# Patient Record
Sex: Female | Born: 1985 | Race: Black or African American | Hispanic: No | Marital: Married | Smoking: Current every day smoker
Health system: Southern US, Community
[De-identification: ages and names within clinical notes are randomized; demographics above are authoritative.]

## PROBLEM LIST (undated history)

## (undated) DIAGNOSIS — N946 Dysmenorrhea, unspecified: Secondary | ICD-10-CM

## (undated) DIAGNOSIS — Z8742 Personal history of other diseases of the female genital tract: Secondary | ICD-10-CM

## (undated) DIAGNOSIS — F32A Depression, unspecified: Secondary | ICD-10-CM

## (undated) DIAGNOSIS — G4730 Sleep apnea, unspecified: Secondary | ICD-10-CM

## (undated) DIAGNOSIS — D649 Anemia, unspecified: Secondary | ICD-10-CM

## (undated) DIAGNOSIS — F329 Major depressive disorder, single episode, unspecified: Secondary | ICD-10-CM

## (undated) HISTORY — PX: TUBAL LIGATION: SHX77

## (undated) HISTORY — DX: Depression, unspecified: F32.A

## (undated) HISTORY — DX: Dysmenorrhea, unspecified: N94.6

## (undated) HISTORY — DX: Personal history of other diseases of the female genital tract: Z87.42

## (undated) HISTORY — DX: Sleep apnea, unspecified: G47.30

## (undated) HISTORY — DX: Major depressive disorder, single episode, unspecified: F32.9

## (undated) HISTORY — DX: Anemia, unspecified: D64.9

---

## 2007-03-13 DIAGNOSIS — Z8742 Personal history of other diseases of the female genital tract: Secondary | ICD-10-CM

## 2007-03-13 HISTORY — DX: Personal history of other diseases of the female genital tract: Z87.42

## 2012-02-19 LAB — HM HIV SCREENING LAB: HM HIV SCREENING: NEGATIVE

## 2012-05-10 HISTORY — PX: LAPAROSCOPIC TUBAL LIGATION: SUR803

## 2016-10-13 ENCOUNTER — Emergency Department: Payer: Medicaid Other

## 2016-10-13 ENCOUNTER — Encounter: Payer: Self-pay | Admitting: Emergency Medicine

## 2016-10-13 ENCOUNTER — Emergency Department
Admission: EM | Admit: 2016-10-13 | Discharge: 2016-10-13 | Disposition: A | Discharge status: Home / Self Care | Payer: Medicaid Other | Attending: Emergency Medicine | Admitting: Emergency Medicine

## 2016-10-13 DIAGNOSIS — W01190A Fall on same level from slipping, tripping and stumbling with subsequent striking against furniture, initial encounter: Secondary | ICD-10-CM | POA: Diagnosis not present

## 2016-10-13 DIAGNOSIS — Y999 Unspecified external cause status: Secondary | ICD-10-CM | POA: Diagnosis not present

## 2016-10-13 DIAGNOSIS — Y9383 Activity, rough housing and horseplay: Secondary | ICD-10-CM | POA: Diagnosis not present

## 2016-10-13 DIAGNOSIS — S20212A Contusion of left front wall of thorax, initial encounter: Secondary | ICD-10-CM | POA: Diagnosis not present

## 2016-10-13 DIAGNOSIS — F17210 Nicotine dependence, cigarettes, uncomplicated: Secondary | ICD-10-CM | POA: Diagnosis not present

## 2016-10-13 DIAGNOSIS — Y929 Unspecified place or not applicable: Secondary | ICD-10-CM | POA: Insufficient documentation

## 2016-10-13 DIAGNOSIS — S20302A Unspecified superficial injuries of left front wall of thorax, initial encounter: Secondary | ICD-10-CM | POA: Diagnosis present

## 2016-10-13 MED ORDER — CYCLOBENZAPRINE HCL 5 MG PO TABS: 5.0000 mg | ORAL_TABLET | Freq: Three times a day (TID) | ORAL | 0 refills | Status: DC | PRN

## 2016-10-13 MED ORDER — KETOROLAC TROMETHAMINE 30 MG/ML IJ SOLN
30.0000 mg | Freq: Once | INTRAMUSCULAR | Status: AC
  Administered 2016-10-13: 30 mg via INTRAMUSCULAR
  Filled 2016-10-13: qty 1

## 2016-10-13 MED ORDER — TETRACAINE HCL 0.5 % OP SOLN: 2.0000 [drp] | Freq: Once | OPHTHALMIC | Status: DC

## 2016-10-13 MED ORDER — KETOROLAC TROMETHAMINE 10 MG PO TABS: 10.0000 mg | ORAL_TABLET | Freq: Four times a day (QID) | ORAL | 0 refills | Status: AC | PRN

## 2016-10-13 NOTE — Discharge Instructions (Signed)
Take medication as prescribed. Return to emergency department if symptoms worsen and follow-up with PCP as needed.   °

## 2016-10-13 NOTE — ED Provider Notes (Signed)
[facility] Emergency Department Provider Note   ____________________________________________   I have reviewed the triage vital signs and the nursing notes.   HISTORY  Chief Complaint Fall    HPI Taylor [name] is a 31 y.o. female presents to the emergency department with left-sided rib pain after falling and landing across her box spring of her bed while playing with her children yesterday. Patient notes pain at rest, with torso movement and with deep breathing. Patient denies noting any crepitus or paradoxical movement of the left rib cage. Patient denies any shortness of breath or difficulty breathing. Patient denies fever, chills, headache, vision changes, chest pain, chest tightness, abdominal pain, nausea and vomiting.  History reviewed. No pertinent past medical history.  There are no active problems to display for this patient.   History reviewed. No pertinent surgical history.  Prior to Admission medications   Medication Sig Start Date End Date Taking? Authorizing Provider  cyclobenzaprine (FLEXERIL) 5 MG tablet Take 1 tablet (5 mg total) by mouth 3 (three) times daily as needed. 10/13/16   Valrie Jia M, PA-C  ketorolac (TORADOL) 10 MG tablet Take 1 tablet (10 mg total) by mouth every 6 (six) hours as needed. 10/13/16 10/18/16  Jaxan Michel, Jordan Likesraci M, PA-C    Allergies Patient has no known allergies.  History reviewed. No pertinent family history.  Social History Social History  Substance Use Topics  . Smoking status: Current Every Day Smoker  . Smokeless tobacco: Never Used  . Alcohol use No    Review of Systems Constitutional: Negative for fever/chills Eyes: No visual changes. ENT:  Negative for sore throat and for difficulty swallowing Cardiovascular: Denies chest pain. Respiratory: Denies cough. Denies shortness of breath. Gastrointestinal: No abdominal pain.  No nausea, vomiting, diarrhea. Genitourinary: Negative for  dysuria. Musculoskeletal: Positive for left-sided rib pain. Skin: Negative for rash. Neurological: Negative for headaches.  Negative focal weakness or numbness. Negative for loss of consciousness. Able to ambulate. ____________________________________________   PHYSICAL EXAM:  VITAL SIGNS: ED Triage Vitals  Enc Vitals Group     BP 10/13/16 1516 121/74     Pulse Rate 10/13/16 1516 85     Resp 10/13/16 1516 20     Temp 10/13/16 1516 98.8 F (37.1 C)     Temp Source 10/13/16 1516 Oral     SpO2 10/13/16 1516 99 %     Weight 10/13/16 1516 106 lb (48.1 kg)     Height 10/13/16 1516 5\' 2"  (1.575 m)     Head Circumference --      Peak Flow --      Pain Score 10/13/16 1512 8     Pain Loc --      Pain Edu? --      Excl. in GC? --     Constitutional: Alert and oriented. Well appearing and in no acute distress.  Eyes: Conjunctivae are normal. PERRL. EOMI  Head: Normocephalic and atraumatic. ENT:      Ears: Canals clear. TMs intact bilaterally.      Nose: No congestion/rhinnorhea.      Mouth/Throat: Mucous membranes are moist. Neck:Supple. No thyromegaly. No stridor.  Cardiovascular: Normal rate, regular rhythm. Normal S1 and S2.  Good peripheral circulation. Respiratory: Normal respiratory effort without tachypnea or retractions. Lungs CTAB. Good air entry to the bases with no decreased or absent breath sounds. Hematological/Lymphatic/Immunological: No cervical lymphadenopathy. Cardiovascular: Normal rate, regular rhythm. Normal distal pulses. Respiratory: Normal respiratory effort. No wheezes/rales/rhonchi. Lungs CTAB with no W/R/R. Gastrointestinal:  Bowel sounds 4 quadrants. Soft and nontender to palpation. No guarding or rigidity. No palpable masses. No distention. No CVA tenderness. Musculoskeletal: Left-sided rib pain with palpable tenderness along lateral ribs and intercostal muscles. No crepitus or paradoxical movement. Nontender with normal range of motion in all  extremities. Neurologic: Normal speech and language. No gross focal neurologic deficits are appreciated. No gait instability.  Skin:  Skin is warm, dry and intact. No rash noted. Psychiatric: Mood and affect are normal. Speech and behavior are normal. Patient exhibits appropriate insight and judgement.  ____________________________________________   LABS (all labs ordered are listed, but only abnormal results are displayed)  Labs Reviewed - No data to display ____________________________________________  EKG None ____________________________________________  RADIOLOGY DG Rib Unilateral w/ chest left FINDINGS: No fracture or other bone lesions are seen involving the ribs. There is no evidence of pneumothorax or pleural effusion. Both lungs are clear. Heart size and mediastinal contours are within normal limits.  IMPRESSION: Negative exam. ____________________________________________   PROCEDURES  Procedure(s) performed: no    Critical Care performed: no ____________________________________________   INITIAL IMPRESSION / ASSESSMENT AND PLAN / ED COURSE  Pertinent labs & imaging results that were available during my care of the patient were reviewed by me and considered in my medical decision making (see chart for details).  Patient presented with left-sided rib pain following a fall. Patient physical exam findings and imaging are reassuring of no acute fracture or neurovascular injury. Patient responded well to Toradol given during course of care in the emergency department. Patient will be given a five-day course of Toradol and and Flexeril for continued pain and inflammation management. Patient advised to follow up with PCP as needed and was also advised to return to the emergency department for symptoms that change or worsen.   ____________________________________________   FINAL CLINICAL IMPRESSION(S) / ED DIAGNOSES  Final diagnoses:  Contusion of rib on left  side, initial encounter       NEW MEDICATIONS STARTED DURING THIS VISIT:  Discharge Medication List as of 10/13/2016  4:25 PM    START taking these medications   Details  cyclobenzaprine (FLEXERIL) 5 MG tablet Take 1 tablet (5 mg total) by mouth 3 (three) times daily as needed., Starting Sat 10/13/2016, Print    ketorolac (TORADOL) 10 MG tablet Take 1 tablet (10 mg total) by mouth every 6 (six) hours as needed., Starting Sat 10/13/2016, Until Thu 10/18/2016, Print         Note:  This document was prepared using Dragon voice recognition software and may include unintentional dictation errors.    Clois ComberLittle, Srihitha Tagliaferri M, PA-C 10/13/16 1647    Phineas SemenGoodman, [place], MD 10/13/16 [phone]

## 2016-10-13 NOTE — ED Triage Notes (Signed)
Pt to ed with c/o fall on Wednesday on left side, rib area.  Pt states she has had pain worse with movement in left ribs since.  Pt ambulates well and appears in nad at triage.

## 2016-10-27 ENCOUNTER — Emergency Department: Payer: Medicaid Other

## 2016-10-27 ENCOUNTER — Encounter: Payer: Self-pay | Admitting: Emergency Medicine

## 2016-10-27 ENCOUNTER — Emergency Department
Admission: EM | Admit: 2016-10-27 | Discharge: 2016-10-27 | Disposition: A | Discharge status: Home / Self Care | Payer: Medicaid Other | Attending: Emergency Medicine | Admitting: Emergency Medicine

## 2016-10-27 DIAGNOSIS — F17200 Nicotine dependence, unspecified, uncomplicated: Secondary | ICD-10-CM | POA: Insufficient documentation

## 2016-10-27 DIAGNOSIS — Y999 Unspecified external cause status: Secondary | ICD-10-CM | POA: Diagnosis not present

## 2016-10-27 DIAGNOSIS — S99912A Unspecified injury of left ankle, initial encounter: Secondary | ICD-10-CM | POA: Diagnosis present

## 2016-10-27 DIAGNOSIS — Y929 Unspecified place or not applicable: Secondary | ICD-10-CM | POA: Insufficient documentation

## 2016-10-27 DIAGNOSIS — Y939 Activity, unspecified: Secondary | ICD-10-CM | POA: Diagnosis not present

## 2016-10-27 DIAGNOSIS — S93402A Sprain of unspecified ligament of left ankle, initial encounter: Secondary | ICD-10-CM | POA: Diagnosis not present

## 2016-10-27 DIAGNOSIS — X58XXXA Exposure to other specified factors, initial encounter: Secondary | ICD-10-CM | POA: Diagnosis not present

## 2016-10-27 MED ORDER — IBUPROFEN 800 MG PO TABS: 800.0000 mg | ORAL_TABLET | Freq: Three times a day (TID) | ORAL | 0 refills | Status: DC | PRN

## 2016-10-27 MED ORDER — IBUPROFEN 800 MG PO TABS
800.0000 mg | ORAL_TABLET | Freq: Once | ORAL | Status: AC
  Administered 2016-10-27: 800 mg via ORAL
  Filled 2016-10-27: qty 1

## 2016-10-27 NOTE — ED Triage Notes (Signed)
Pt c/o twisted ankle last night.  C/o pain to left ankle. No deformity noted.  Reports cannot walk r/t pain.

## 2016-10-27 NOTE — ED Provider Notes (Signed)
[facility] Emergency Department Provider Note   ____________________________________________   I have reviewed the triage vital signs and the nursing notes.   HISTORY  Chief Complaint Ankle Pain    HPI Taylor [name] is a 31 y.o. female Presents to emergency department with left ankle pain. Patient reports twisting her ankle last night however, she is unable to recall how she injured it. Patient stated she "had been drinking" and is unsure of the mechanism of injury. Patient noted pain initially tolerable however, when she awoke she was unable to bear weight with increase swelling. Patient localizes pain to the lateral malleoli. Patient reports pain that increases with movement and denies changes in sensation. Patient denies fever, chills, headache, vision changes, chest pain, chest tightness, shortness of breath, abdominal pain, nausea and vomiting.  History reviewed. No pertinent past medical history.  There are no active problems to display for this patient.   History reviewed. No pertinent surgical history.  Prior to Admission medications   Medication Sig Start Date End Date Taking? Authorizing Provider  cyclobenzaprine (FLEXERIL) 5 MG tablet Take 1 tablet (5 mg total) by mouth 3 (three) times daily as needed. 10/13/16   Jerricka Carvey M, PA-C  ibuprofen (ADVIL,MOTRIN) 800 MG tablet Take 1 tablet (800 mg total) by mouth every 8 (eight) hours as needed for moderate pain. 10/27/16   Jhania Etherington M, PA-C    Allergies Patient has no known allergies.  History reviewed. No pertinent family history.  Social History Social History  Substance Use Topics  . Smoking status: Current Every Day Smoker  . Smokeless tobacco: Never Used  . Alcohol use No    Review of Systems Constitutional: Negative for fever/chills Eyes: No visual changes. ENT:  Negative for sore throat and for difficulty swallowing Cardiovascular: Denies chest pain. Respiratory: Denies  cough. Denies shortness of breath. Gastrointestinal: No abdominal pain.  No nausea, vomiting, diarrhea. Genitourinary: Negative for dysuria. Musculoskeletal: Positivefor left lateral ankle pain and swelling Skin: Negative for rash. Neurological: Negative for headaches.  Negative focal weakness or numbness. Negative for loss of consciousness. Unableto ambulate. ____________________________________________   PHYSICAL EXAM:  VITAL SIGNS: ED Triage Vitals [10/27/16 1521]  Enc Vitals Group     BP (!) 130/91     Pulse Rate (!) 102     Resp 16     Temp 98.9 F (37.2 C)     Temp Source Oral     SpO2 97 %     Weight 105 lb (47.6 kg)     Height 5\' 2"  (1.575 m)     Head Circumference      Peak Flow      Pain Score 10     Pain Loc      Pain Edu?      Excl. in GC?     Constitutional: Alert and oriented. Well appearing and in no acute distress.  Eyes: Conjunctivae are normal. PERRL. EOMI  Head: Normocephalic and atraumatic. ENT:      Ears: Canals clear. TMs intact bilaterally.      Nose: No congestion/rhinnorhea.      Mouth/Throat: Mucous membranes are moist.  Neck:Supple. No thyromegaly. No stridor.  Cardiovascular: Normal rate, regular rhythm. Normal S1 and S2.  Good peripheral circulation. Respiratory: Normal respiratory effort without tachypnea or retractions. Lungs CTAB. No wheezes/rales/rhonchi. Good air entry to the bases with no decreased or absent breath sounds. Hematological/Lymphatic/Immunological: No cervical lymphadenopathy. Cardiovascular: Normal rate, regular rhythm. Normal distal pulses. Gastrointestinal: Bowel sounds 4  quadrants. Soft and nontender to palpation. No guarding or rigidity. No palpable masses. No distention. No CVA tenderness. Musculoskeletal: Left lateral ankle pain with swelling along the left malleoli. Tenderness to palpation over the lateral ankle negative into structures. Ankle range motion intact in all planes however painful. Intact sensation  throughout the foot and ankle. Otherwise, nontender with normal range of motion in all extremities. Neurologic: Normal speech and language. No gross focal neurologic deficits are appreciated.  Skin:  Skin is warm, dry and intact. No rash noted. Psychiatric: Mood and affect are normal. Speech and behavior are normal. Patient exhibits appropriate insight and judgement.  ____________________________________________   LABS (all labs ordered are listed, but only abnormal results are displayed)  Labs Reviewed - No data to display ____________________________________________  EKG none ____________________________________________  RADIOLOGY DG ankle complete left FINDINGS: Frontal, oblique, and lateral views were obtained. There is mild soft tissue swelling with focal joint effusion. There is no evident fracture. No appreciable joint space narrowing or erosion. Ankle mortise appears intact.  IMPRESSION: Soft tissue swelling with joint effusion. Question ligamentous injury. No evident fracture. Ankle mortise appears intact. ____________________________________________   PROCEDURES  Procedure(s) performed:  SPLINT APPLICATION Date/Time: 5:41 PM Authorized by: Clois Comber Consent: Verbal consent obtained. Risks and benefits: risks, benefits and alternatives were discussed Consent given by: patient Splint applied by: RN Location details: left ankle Splint type: ortho-Glass stirrup splint and Ace wrap Supplies used: ortho-Glass stirrup splint and Ace wrap Post-procedure: The splinted body part was neurovascularly unchanged following the procedure. Patient tolerance: Patient tolerated the procedure well with no immediate complications.    Critical Care performed: no ____________________________________________   INITIAL IMPRESSION / ASSESSMENT AND PLAN / ED COURSE  Pertinent labs & imaging results that were available during my care of the patient were reviewed by me and  considered in my medical decision making (see chart for details).   Patient presented with left ankle pain after twisting her ankle yesterday. Patient history,physical exam findings and imaging are reassuring of no acute fracture or neurovascular injury. Left ankle stabilized with Ortho-Glass stirrup splint and patient given crutches for mobility. Patient instructed to utilize crutches weightbearing as tolerated and when not walking or standing keep ankle elevated. Also instructed to utilize ice for pain and inflammation. Patient will be given ibuprofenfor continued pain management. Patient advised to follow up with Orthopedics for continued care as needed and was also advised to return to the emergency department for symptoms that change or worsen.   ____________________________________________   FINAL CLINICAL IMPRESSION(S) / ED DIAGNOSES  Final diagnoses:  Sprain of left ankle, unspecified ligament, initial encounter       NEW MEDICATIONS STARTED DURING THIS VISIT:  New Prescriptions   IBUPROFEN (ADVIL,MOTRIN) 800 MG TABLET    Take 1 tablet (800 mg total) by mouth every 8 (eight) hours as needed for moderate pain.     Note:  This document was prepared using Dragon voice recognition software and may include unintentional dictation errors.    Percell Boston 10/27/16 1741    Jene Every, MD 10/27/16 [phone]

## 2016-10-27 NOTE — Discharge Instructions (Signed)
Walk with crutches as needed with weight bearing as tolerated. When not walking or standing keep ankle elevated and use cold pack for pain and inflammation. Follow up with Orthopedics if symptoms do not improve.

## 2016-11-02 DIAGNOSIS — S93492A Sprain of other ligament of left ankle, initial encounter: Secondary | ICD-10-CM | POA: Diagnosis not present

## 2016-12-26 ENCOUNTER — Emergency Department
Admission: EM | Admit: 2016-12-26 | Discharge: 2016-12-26 | Discharge status: Against Medical Advice | Payer: Medicaid Other | Attending: Emergency Medicine | Admitting: Emergency Medicine

## 2016-12-26 ENCOUNTER — Emergency Department
Admission: EM | Admit: 2016-12-26 | Discharge: 2016-12-26 | Disposition: A | Discharge status: Home / Self Care | Payer: Medicaid Other | Source: Home / Self Care | Attending: Emergency Medicine | Admitting: Emergency Medicine

## 2016-12-26 ENCOUNTER — Emergency Department: Payer: Medicaid Other

## 2016-12-26 ENCOUNTER — Encounter: Payer: Self-pay | Admitting: Emergency Medicine

## 2016-12-26 DIAGNOSIS — R091 Pleurisy: Secondary | ICD-10-CM

## 2016-12-26 DIAGNOSIS — J069 Acute upper respiratory infection, unspecified: Secondary | ICD-10-CM

## 2016-12-26 DIAGNOSIS — R079 Chest pain, unspecified: Secondary | ICD-10-CM | POA: Diagnosis not present

## 2016-12-26 DIAGNOSIS — M542 Cervicalgia: Secondary | ICD-10-CM | POA: Diagnosis not present

## 2016-12-26 DIAGNOSIS — F17200 Nicotine dependence, unspecified, uncomplicated: Secondary | ICD-10-CM | POA: Diagnosis not present

## 2016-12-26 DIAGNOSIS — M25512 Pain in left shoulder: Secondary | ICD-10-CM | POA: Diagnosis not present

## 2016-12-26 DIAGNOSIS — Z5320 Procedure and treatment not carried out because of patient's decision for unspecified reasons: Secondary | ICD-10-CM | POA: Insufficient documentation

## 2016-12-26 LAB — CBC WITH DIFFERENTIAL/PLATELET
BASOS ABS: 0 10*3/uL (ref 0–0.1)
Basophils Relative: 0 %
Eosinophils Absolute: 0.1 10*3/uL (ref 0–0.7)
Eosinophils Relative: 1 %
HEMATOCRIT: 37.2 % (ref 35.0–47.0)
Hemoglobin: 12.3 g/dL (ref 12.0–16.0)
LYMPHS ABS: 1.9 10*3/uL (ref 1.0–3.6)
LYMPHS PCT: 17 %
MCH: 31.5 pg (ref 26.0–34.0)
MCHC: 32.9 g/dL (ref 32.0–36.0)
MCV: 95.5 fL (ref 80.0–100.0)
MONO ABS: 0.8 10*3/uL (ref 0.2–0.9)
MONOS PCT: 7 %
NEUTROS ABS: 8.5 10*3/uL — AB (ref 1.4–6.5)
Neutrophils Relative %: 75 %
Platelets: 255 10*3/uL (ref 150–440)
RBC: 3.9 MIL/uL (ref 3.80–5.20)
RDW: 17 % — AB (ref 11.5–14.5)
WBC: 11.3 10*3/uL — ABNORMAL HIGH (ref 3.6–11.0)

## 2016-12-26 LAB — COMPREHENSIVE METABOLIC PANEL
ALBUMIN: 4 g/dL (ref 3.5–5.0)
ALT: 8 U/L — ABNORMAL LOW (ref 14–54)
ANION GAP: 10 (ref 5–15)
AST: 17 U/L (ref 15–41)
Alkaline Phosphatase: 73 U/L (ref 38–126)
BUN: 10 mg/dL (ref 6–20)
CHLORIDE: 106 mmol/L (ref 101–111)
CO2: 21 mmol/L — ABNORMAL LOW (ref 22–32)
Calcium: 9.7 mg/dL (ref 8.9–10.3)
Creatinine, Ser: 0.72 mg/dL (ref 0.44–1.00)
GFR calc Af Amer: >60 mL/min (ref >60)
GFR calc non Af Amer: >60 mL/min (ref >60)
GLUCOSE: 124 mg/dL — AB (ref 65–99)
POTASSIUM: 4 mmol/L (ref 3.5–5.1)
SODIUM: 137 mmol/L (ref 135–145)
Total Bilirubin: 0.5 mg/dL (ref 0.3–1.2)
Total Protein: 7.3 g/dL (ref 6.5–8.1)

## 2016-12-26 LAB — TROPONIN I: Troponin I: <0.03 ng/mL (ref <0.03)

## 2016-12-26 MED ORDER — IBUPROFEN 600 MG PO TABS: 600.0000 mg | ORAL_TABLET | Freq: Three times a day (TID) | ORAL | 0 refills | Status: DC | PRN

## 2016-12-26 MED ORDER — ACETAMINOPHEN 500 MG PO TABS
1000.0000 mg | ORAL_TABLET | Freq: Once | ORAL | Status: AC
  Administered 2016-12-26: 1000 mg via ORAL

## 2016-12-26 MED ORDER — IBUPROFEN 400 MG PO TABS
600.0000 mg | ORAL_TABLET | Freq: Once | ORAL | Status: AC
  Administered 2016-12-26: 600 mg via ORAL

## 2016-12-26 MED ORDER — ACETAMINOPHEN 500 MG PO TABS
ORAL_TABLET | ORAL | Status: AC
  Administered 2016-12-26: 1000 mg via ORAL
  Filled 2016-12-26: qty 2

## 2016-12-26 MED ORDER — IBUPROFEN 600 MG PO TABS
ORAL_TABLET | ORAL | Status: AC
  Administered 2016-12-26: 600 mg via ORAL
  Filled 2016-12-26: qty 1

## 2016-12-26 NOTE — Discharge Instructions (Signed)
It is normal feet be sick for a full 5-7 days with this infection. Please take 600 mg of ibuprofen 3 times a day as needed for pain and fever. Return to the emergency department for any concerns such as worsening pain, worsening shortness of breath, or for any other issues whatsoever.  It was a pleasure to take care of you today, and thank you for coming to our emergency department.  If you have any questions or concerns before leaving please ask the nurse to grab me and I'm more than happy to go through your aftercare instructions again.  If you were prescribed any opioid pain medication today such as Norco, Vicodin, Percocet, morphine, hydrocodone, or oxycodone please make sure you do not drive when you are taking this medication as it can alter your ability to drive safely.  If you have any concerns once you are home that you are not improving or are in fact getting worse before you can make it to your follow-up appointment, please do not hesitate to call [phone] and come back for further evaluation.  Merrily Brittle, MD  Results for orders placed or performed during the hospital encounter of 12/26/16  Comprehensive metabolic panel  Result Value Ref Range   Sodium 137 135 - 145 mmol/L   Potassium 4.0 3.5 - 5.1 mmol/L   Chloride 106 101 - 111 mmol/L   CO2 21 (L) 22 - 32 mmol/L   Glucose, Bld 124 (H) 65 - 99 mg/dL   BUN 10 6 - 20 mg/dL   Creatinine, Ser 3.24 0.44 - 1.00 mg/dL   Calcium 9.7 8.9 - 40.1 mg/dL   Total Protein 7.3 6.5 - 8.1 g/dL   Albumin 4.0 3.5 - 5.0 g/dL   AST 17 15 - 41 U/L   ALT 8 (L) 14 - 54 U/L   Alkaline Phosphatase 73 38 - 126 U/L   Total Bilirubin 0.5 0.3 - 1.2 mg/dL   GFR calc non Af Amer >60 >60 mL/min   GFR calc Af Amer >60 >60 mL/min   Anion gap 10 5 - 15  CBC with Differential  Result Value Ref Range   WBC 11.3 (H) 3.6 - 11.0 K/uL   RBC 3.90 3.80 - 5.20 MIL/uL   Hemoglobin 12.3 12.0 - 16.0 g/dL   HCT 02.7 25.3 - 66.4 %   MCV 95.5 80.0 - 100.0 fL   MCH 31.5  26.0 - 34.0 pg   MCHC 32.9 32.0 - 36.0 g/dL   RDW 40.3 (H) 47.4 - 25.9 %   Platelets 255 150 - 440 K/uL   Neutrophils Relative % 75 %   Neutro Abs 8.5 (H) 1.4 - 6.5 K/uL   Lymphocytes Relative 17 %   Lymphs Abs 1.9 1.0 - 3.6 K/uL   Monocytes Relative 7 %   Monocytes Absolute 0.8 0.2 - 0.9 K/uL   Eosinophils Relative 1 %   Eosinophils Absolute 0.1 0 - 0.7 K/uL   Basophils Relative 0 %   Basophils Absolute 0.0 0 - 0.1 K/uL  Troponin I  Result Value Ref Range   Troponin I <0.03 <0.03 ng/mL   Dg Chest 2 View  Result Date: 12/26/2016 CLINICAL DATA:  Left chest pain and shortness of breath.  Fever. EXAM: CHEST  2 VIEW COMPARISON:  10/13/2016 FINDINGS: The cardiomediastinal silhouette is within normal limits. The lungs are well inflated and clear. There is no evidence of pleural effusion or pneumothorax. No acute osseous abnormality is identified. IMPRESSION: No active cardiopulmonary disease. Electronically  Signed   By: Sebastian AcheAllen  Grady M.D.   On: 12/26/2016 18:33

## 2016-12-26 NOTE — ED Notes (Signed)
Pt verbalizes d/c teaching and rx. Pt in NAD at time of d/c, pt unable to sign due to signature pad malfnx.

## 2016-12-26 NOTE — ED Triage Notes (Addendum)
Pt returns after having to leave earlier today with c/o left shoulder, neck and left scapula pain that started earlier today.  Pt had to leave earlier to pick her kids up from school.

## 2016-12-26 NOTE — ED Triage Notes (Signed)
Patient states she was sitting car and started having pain to left shoulder, left neck, left scapula.  Denies injury.

## 2016-12-26 NOTE — Discharge Instructions (Signed)
You have decided to discharge prior to your full evaluation for chest pain. Return to the ED immediately for worsening symptoms. Return as soon as possible for continued symptoms.

## 2016-12-26 NOTE — ED Provider Notes (Signed)
[facility] Emergency Department Provider Note  ____________________________________________   First MD Initiated Contact with Patient 12/26/16 1747     (approximate)  I have reviewed the triage vital signs and the nursing notes.   HISTORY  Chief Complaint Muscle Pain (left shoulder, left scapula and left sided neck)    HPI Taylor [name] is a 31 y.o. female history of present emergency department with sudden onset left chest pleuritic pain that began around 3 hours prior to arrival. It happened when she was sitting in her car waiting for her daughter and she got up and began pacing around, however the pain was so intense she called 911 and initially came to our emergency department. She was here for roughly half hour, however after receiving an EKG she received notification that she had to pick her children up from school so she left AGAINST MEDICAL ADVICE and immediately subsequently returned for further evaluation. Her pain is constant throbbing aching. It seems to be a little bit worse when lying flat but not particularly improved with sitting up. For the past 2 days she has had a low-grade fever at home as well as a sore throat and rhinorrhea and nonproductive cough.   History reviewed. No pertinent past medical history.  There are no active problems to display for this patient.   History reviewed. No pertinent surgical history.  Prior to Admission medications   Medication Sig Start Date End Date Taking? Authorizing Provider  cyclobenzaprine (FLEXERIL) 5 MG tablet Take 1 tablet (5 mg total) by mouth 3 (three) times daily as needed. 10/13/16   Little, Traci M, PA-C  ibuprofen (ADVIL,MOTRIN) 600 MG tablet Take 1 tablet (600 mg total) by mouth every 8 (eight) hours as needed. 12/26/16   Merrily Brittle, MD  ibuprofen (ADVIL,MOTRIN) 800 MG tablet Take 1 tablet (800 mg total) by mouth every 8 (eight) hours as needed for moderate pain. 10/27/16   Little, Traci  M, PA-C    Allergies Patient has no known allergies.  No family history on file.  Social History Social History  Substance Use Topics  . Smoking status: Current Every Day Smoker  . Smokeless tobacco: Never Used  . Alcohol use No    Review of Systems Constitutional: positive for fevers ENT: No sore throat. Cardiovascular: positive for chest pain. Respiratory: positive for shortness of breath. Gastrointestinal: No abdominal pain.  No nausea, no vomiting.  No diarrhea.  No constipation. Musculoskeletal: Negative for back pain. Neurological: Negative for headaches   ____________________________________________   PHYSICAL EXAM:  VITAL SIGNS: ED Triage Vitals [12/26/16 1728]  Enc Vitals Group     BP 136/73     Pulse Rate 73     Resp 18     Temp 98.3 F (36.8 C)     Temp Source Oral     SpO2 99 %     Weight      Height      Head Circumference      Peak Flow      Pain Score 8     Pain Loc      Pain Edu?      Excl. in GC?     Constitutional: alert and oriented 4 appears somewhat uncomfortable nontoxic no diaphoresis speaks in full clear sentences although with nasal voice Head: Atraumatic. Nose: positive for congestion Mouth/Throat: No trismus Neck: No stridor.   Cardiovascular: regular rate and rhythm no murmurs. Focally tender across left anterior chest Respiratory: Normal respiratory effort.  No  retractions. Gastrointestinal: soft nontender Neurologic:  Normal speech and language. No gross focal neurologic deficits are appreciated.  Skin:  Skin is warm, dry and intact. No rash noted.    ____________________________________________  LABS (all labs ordered are listed, but only abnormal results are displayed)  Labs Reviewed  COMPREHENSIVE METABOLIC PANEL - Abnormal; Notable for the following:       Result Value   CO2 21 (*)    Glucose, Bld 124 (*)    ALT 8 (*)    All other components within normal limits  CBC WITH DIFFERENTIAL/PLATELET - Abnormal;  Notable for the following:    WBC 11.3 (*)    RDW 17.0 (*)    Neutro Abs 8.5 (*)    All other components within normal limits  TROPONIN I    blood work reviewed and interpreted by me shows slightly elevated white count which is nonspecific and likely secondary to pain __________________________________________  EKG  ED ECG REPORT I, Merrily BrittleNeil Georgi Tuel, the attending physician, personally viewed and interpreted this ECG.  Date: 12/26/2016 EKG Time:  Rate: 77 Rhythm: normal sinus rhythm QRS Axis: normal Intervals: normal ST/T Wave abnormalities: normal Narrative Interpretation: no evidence of acute ischemia  ____________________________________________  RADIOLOGY  chest x-ray reviewed by me with no acute disease ____________________________________________   DIFFERENTIAL includes but not limited to  bronchitis, pneumonia, pericarditis, myocarditis, pericardial effusion   PROCEDURES  Procedure(s) performed: no  Procedures  Critical Care performed: no  Observation: no ____________________________________________   INITIAL IMPRESSION / ASSESSMENT AND PLAN / ED COURSE  Pertinent labs & imaging results that were available during my care of the patient were reviewed by me and considered in my medical decision making (see chart for details).  The patient arrives with congestion and dry cough along with atypical chest pain. Chest x-ray and blood work are pending well nonsteroidals are being given.     ----------------------------------------- 6:50 PM on 12/26/2016 -----------------------------------------  The patient feels markedly improved after ibuprofen and Tylenol. Her EKG is unremarkable, she has a negative troponin, and chest x-rays negative for pneumonia. Her symptoms are most consistent with upper respiratory tract infection and costochondritis versus pleurisy. Regardless she would benefit from a week of nonsteroidals and strict return precautions. The  patient verbalizes understanding and agreement with the plan. ____________________________________________   FINAL CLINICAL IMPRESSION(S) / ED DIAGNOSES  Final diagnoses:  Upper respiratory tract infection, unspecified type  Pleurisy      NEW MEDICATIONS STARTED DURING THIS VISIT:  New Prescriptions   IBUPROFEN (ADVIL,MOTRIN) 600 MG TABLET    Take 1 tablet (600 mg total) by mouth every 8 (eight) hours as needed.     Note:  This document was prepared using Dragon voice recognition software and may include unintentional dictation errors.      Merrily Brittleifenbark, Ahnaf Caponi, MD 12/26/16 [phone]

## 2016-12-26 NOTE — ED Provider Notes (Signed)
[facility] Emergency Department Provider Note ____________________________________________  Time seen: 1605  I have reviewed the triage vital signs and the nursing notes.  HISTORY  Chief Complaint  Back Pain  HPI Taylor [name] is a 31 y.o. female presents to the ED via EMS, for complaints of sudden onset of left chest pain with shoulder neck and scapular pain. Patient denies any injury, accident, or trauma. She also denies any shortness of breath, diaphoresis, or nausea. She describes she had abnormal level of health and activity prior to onset. 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 She describes chest tightness which is worsened in between breaths She reports improvement in her symptoms, but denies resolution. She denies any significant medical history. She is without current chest pain.   History reviewed. No pertinent past medical history.  There are no active problems to display for this patient.  History reviewed. No pertinent surgical history.  Prior to Admission medications   Medication Sig Start Date End Date Taking? Authorizing Provider  cyclobenzaprine (FLEXERIL) 5 MG tablet Take 1 tablet (5 mg total) by mouth 3 (three) times daily as needed. 10/13/16   Little, Traci M, PA-C  ibuprofen (ADVIL,MOTRIN) 800 MG tablet Take 1 tablet (800 mg total) by mouth every 8 (eight) hours as needed for moderate pain. 10/27/16   Little, Traci M, PA-C   Allergies Patient has no known allergies.  No family history on file.  Social History Social History  Substance Use Topics  . Smoking status: Current Every Day Smoker  . Smokeless tobacco: Never Used  . Alcohol use No    Review of Systems  Constitutional: Negative for fever. Eyes: Negative for visual changes. ENT: Negative for sore throat. Cardiovascular: Positive for chest pain. Respiratory: Negative for shortness of  breath. Gastrointestinal: Negative for abdominal pain, vomiting and diarrhea. Genitourinary: Negative for dysuria. Musculoskeletal: Negative for back pain. Skin: Negative for rash. Neurological: Negative for headaches, focal weakness or numbness. ____________________________________________  PHYSICAL EXAM:  VITAL SIGNS: ED Triage Vitals  Enc Vitals Group     BP 12/26/16 1534 108/62     Pulse Rate 12/26/16 1534 88     Resp 12/26/16 1534 16     Temp 12/26/16 1534 99.6 F (37.6 C)     Temp Source 12/26/16 1534 Oral     SpO2 12/26/16 1534 99 %     Weight 12/26/16 1532 106 lb (48.1 kg)     Height 12/26/16 1532 5\' 2"  (1.575 m)     Head Circumference --      Peak Flow --      Pain Score 12/26/16 1532 9     Pain Loc --      Pain Edu? --      Excl. in GC? --     Constitutional: Alert and oriented. Well appearing and in no distress. Head: Normocephalic and atraumatic. Eyes: Conjunctivae are normal. Normal extraocular movements Neck: Supple. No thyromegaly. Cardiovascular: Normal rate, regular rhythm. Normal distal pulses. Respiratory: Normal respiratory effort. No wheezes/rales/rhonchi. Gastrointestinal: Soft and nontender. No distention. Musculoskeletal: Nontender with normal range of motion in all extremities.  Neurologic:  Normal gait without ataxia. Normal speech and language. No gross focal neurologic deficits are appreciated. Skin:  Skin is warm, dry and intact. No rash noted. Psychiatric: Mood and affect are normal. Patient exhibits appropriate insight and judgment. ____________________________________________  EKG  NSR 84 bpm Normal axis  No STEMI ____________________________________________  INITIAL IMPRESSION / ASSESSMENT AND PLAN / ED COURSE  Patient with ED evaluation via EMS, for sudden chest wall pain. Patient notifies the nurse prior to her her evaluation by this provider, that she is going to have to decline any further treatment or evaluation. Patient  describes she has to go back to pick up her children. The caregivers that were initially supposed to keep the toes while she was here being evaluated are not available. The patient understands from this provider that her chest pain work-up has not been completed. She further understands that I cannot at this time discharge her with routine follow-up. She will be discharged AMA, but understands that she is to return to the ED soon as possible. She should also return at any time given her chest pain complaints. She may opt to call 911 for EMS transfer if necessary. Patient verbalizes understanding and endorses that she will return to the ED for further evaluation. The patient appears stable from the standpoint of her vital signs and clinical picture at the time of this disposition. She is discharged to her own care with AMA status at this time for acute left-sided chest pain of unknown etiology at this time. ____________________________________________  FINAL CLINICAL IMPRESSION(S) / ED DIAGNOSES  Final diagnoses:  Chest pain, unspecified type      Lissa Hoard, PA-C 12/26/16 1650    Don Perking, [place], MD 12/27/16 2314

## 2016-12-26 NOTE — ED Notes (Signed)
Patient presents to the ED with left arm pain that came suddenly around 2:30pm.  Patient states pain went into jaw.  Patient states, "I felt scared."  Patient is now wanting to leave to go pick up her children from school.  Patient states pain has resolved.  Patient came to the ED by EMS.

## 2017-01-14 ENCOUNTER — Other Ambulatory Visit: Payer: Self-pay

## 2017-01-14 ENCOUNTER — Encounter: Payer: Self-pay | Admitting: Family Medicine

## 2017-01-14 ENCOUNTER — Ambulatory Visit: Payer: Medicaid Other | Admitting: Family Medicine

## 2017-01-14 VITALS — BP 123/79 | HR 85 | Temp 98.2°F | Ht 61.75 in | Wt 101.0 lb

## 2017-01-14 DIAGNOSIS — D649 Anemia, unspecified: Secondary | ICD-10-CM

## 2017-01-14 DIAGNOSIS — Z7689 Persons encountering health services in other specified circumstances: Secondary | ICD-10-CM | POA: Diagnosis not present

## 2017-01-14 NOTE — Progress Notes (Signed)
   BP 123/79   Pulse 85   Temp 98.2 F (36.8 C)   Ht 5' 1.75" (1.568 m)   Wt 101 lb (45.8 kg)   LMP 12/29/2016 (Approximate)   SpO2 99%   BMI 18.62 kg/m    Subjective:    Patient ID: Taylor [name], female    DOB: Dec 22, 1985, 31 y.o.   MRN: [id number]  HPI: Taylor [name] is a 31 y.o. female  Chief Complaint  Patient presents with  . Establish Care    no concerns today   Patient presents today to establish care. No concerns today. No known medical issues other than a hx of anemia, no regular medications. Hx of abnormal pap smear several years ago s/p colposcopy and subsequent normal pap smears. Last CPE was more than a year ago.   Relevant past medical, surgical, family and social history reviewed and updated as indicated. Interim medical history since our last visit reviewed. Allergies and medications reviewed and updated.  Review of Systems  Constitutional: Negative.   HENT: Negative.   Eyes: Negative.   Respiratory: Negative.   Cardiovascular: Negative.   Gastrointestinal: Negative.   Genitourinary: Negative.   Musculoskeletal: Negative.   Neurological: Negative.   Psychiatric/Behavioral: Negative.    Per HPI unless specifically indicated above     Objective:    BP 123/79   Pulse 85   Temp 98.2 F (36.8 C)   Ht 5' 1.75" (1.568 m)   Wt 101 lb (45.8 kg)   LMP 12/29/2016 (Approximate)   SpO2 99%   BMI 18.62 kg/m   Wt Readings from Last 3 Encounters:  01/14/17 101 lb (45.8 kg)  12/26/16 106 lb (48.1 kg)  10/27/16 105 lb (47.6 kg)    Physical Exam  Constitutional: She is oriented to person, place, and time. She appears well-developed and well-nourished.  HENT:  Head: Atraumatic.  Eyes: Conjunctivae are normal. No scleral icterus.  Neck: Normal range of motion. Neck supple.  Cardiovascular: Normal rate and normal heart sounds.  Pulmonary/Chest: Effort normal and breath sounds normal. No respiratory distress.  Musculoskeletal: Normal range of motion.    Neurological: She is alert and oriented to person, place, and time.  Skin: Skin is warm and dry.  Psychiatric: She has a normal mood and affect. Her behavior is normal.  Nursing note and vitals reviewed.     Assessment & Plan:   Problem List Items Addressed This Visit      Other   Anemia - Primary    Will chck CBC at upcoming CPE. Multivitamin with iron and iron rich foods in the meantime. Pt asymptomatic.        Other Visit Diagnoses    Encounter to establish care           Follow up plan: Return for CPE.

## 2017-01-16 DIAGNOSIS — D649 Anemia, unspecified: Secondary | ICD-10-CM | POA: Insufficient documentation

## 2017-01-16 NOTE — Patient Instructions (Signed)
Follow up for CPE 

## 2017-01-16 NOTE — Assessment & Plan Note (Addendum)
Will chck CBC at upcoming CPE. Multivitamin with iron and iron rich foods in the meantime. Pt asymptomatic.

## 2017-03-19 ENCOUNTER — Encounter: Payer: Self-pay | Admitting: Family Medicine

## 2017-03-19 ENCOUNTER — Ambulatory Visit (INDEPENDENT_AMBULATORY_CARE_PROVIDER_SITE_OTHER): Payer: Medicaid Other | Admitting: Family Medicine

## 2017-03-19 VITALS — BP 108/66 | HR 80 | Temp 98.5°F | Ht 61.75 in | Wt 102.4 lb

## 2017-03-19 DIAGNOSIS — Z0000 Encounter for general adult medical examination without abnormal findings: Secondary | ICD-10-CM

## 2017-03-19 DIAGNOSIS — Z23 Encounter for immunization: Secondary | ICD-10-CM

## 2017-03-19 LAB — UA/M W/RFLX CULTURE, ROUTINE
Bilirubin, UA: NEGATIVE
GLUCOSE, UA: NEGATIVE
KETONES UA: NEGATIVE
NITRITE UA: NEGATIVE
PROTEIN UA: NEGATIVE
RBC, UA: NEGATIVE
Urobilinogen, Ur: 0.2 mg/dL (ref 0.2–1.0)
pH, UA: 5.5 (ref 5.0–7.5)

## 2017-03-19 LAB — MICROSCOPIC EXAMINATION: RBC MICROSCOPIC, UA: NONE SEEN /HPF (ref 0–?)

## 2017-03-19 NOTE — Progress Notes (Signed)
BP 108/66 (BP Location: Right Arm, Patient Position: Sitting, Cuff Size: Normal)   Pulse 80   Temp 98.5 F (36.9 C) (Oral)   Ht 5' 1.75" (1.568 m)   Wt 102 lb 6.4 oz (46.4 kg)   SpO2 100%   BMI 18.88 kg/m    Subjective:    Patient ID: Taylor [name], female    DOB: 04-29-1985, 32 y.o.   MRN: [id number]  HPI: Taylor [name] is a 32 y.o. female presenting on 03/19/2017 for comprehensive medical examination. Current medical complaints include:none  Not fasting for labs.   Last pap was in 2017 and was WNL.   Depression Screen done today and results listed below:  Depression screen [facility] 2/9 01/14/2017  Decreased Interest 0  Down, Depressed, Hopeless 0  PHQ - 2 Score 0    The patient does not have a history of falls. I did not complete a risk assessment for falls. A plan of care for falls was not documented.   Past Medical History:  Past Medical History:  Diagnosis Date  . Anemia   . Depression   . History of abnormal cervical Pap smear 2009  . Sleep apnea     Surgical History:  Past Surgical History:  Procedure Laterality Date  . TUBAL LIGATION      Medications:  No current outpatient medications on file prior to visit.   No current facility-administered medications on file prior to visit.     Allergies:  No Known Allergies  Social History:  Social History   Socioeconomic History  . Marital status: Significant Other    Spouse name: Terressa Koyanagi  . Number of children: Not on file  . Years of education: Not on file  . Highest education level: Not on file  Social Needs  . Financial resource strain: Patient refused  . Food insecurity - worry: Patient refused  . Food insecurity - inability: Patient refused  . Transportation needs - medical: Patient refused  . Transportation needs - non-medical: Patient refused  Occupational History    Employer: [employer]  Tobacco Use  . Smoking status: Current Every Day Smoker    Packs/day: 0.50  . Smokeless  tobacco: Never Used  Substance and Sexual Activity  . Alcohol use: No  . Drug use: No  . Sexual activity: Not on file  Other Topics Concern  . Not on file  Social History Narrative  . Not on file   Social History   Tobacco Use  Smoking Status Current Every Day Smoker  . Packs/day: 0.50  Smokeless Tobacco Never Used   Social History   Substance and Sexual Activity  Alcohol Use No    Family History:  Family History  Problem Relation Age of Onset  . Diabetes Mother   . Diabetes Maternal Grandmother   . Heart disease Maternal Grandmother   . Sickle cell anemia Sister   . Cancer Maternal Aunt        breast    Past medical history, surgical history, medications, allergies, family history and social history reviewed with patient today and changes made to appropriate areas of the chart.   Review of Systems - General ROS: negative Psychological ROS: negative Ophthalmic ROS: negative ENT ROS: negative Breast ROS: negative for breast lumps Respiratory ROS: no cough, shortness of breath, or wheezing Cardiovascular ROS: no chest pain or dyspnea on exertion Gastrointestinal ROS: no abdominal pain, change in bowel habits, or black or bloody stools Genito-Urinary ROS: no dysuria, trouble voiding, or hematuria Musculoskeletal  ROS: negative Neurological ROS: no TIA or stroke symptoms Dermatological ROS: negative All other ROS negative except what is listed above and in the HPI.      Objective:    BP 108/66 (BP Location: Right Arm, Patient Position: Sitting, Cuff Size: Normal)   Pulse 80   Temp 98.5 F (36.9 C) (Oral)   Ht 5' 1.75" (1.568 m)   Wt 102 lb 6.4 oz (46.4 kg)   SpO2 100%   BMI 18.88 kg/m   Wt Readings from Last 3 Encounters:  03/19/17 102 lb 6.4 oz (46.4 kg)  01/14/17 101 lb (45.8 kg)  12/26/16 106 lb (48.1 kg)    Physical Exam  Constitutional: She is oriented to person, place, and time. She appears well-developed and well-nourished. No distress.  HENT:    Head: Atraumatic.  Right Ear: External ear normal.  Left Ear: External ear normal.  Nose: Nose normal.  Mouth/Throat: Oropharynx is clear and moist. No oropharyngeal exudate.  Eyes: Conjunctivae are normal. Pupils are equal, round, and reactive to light. No scleral icterus.  Neck: Normal range of motion. Neck supple. No thyromegaly present.  Cardiovascular: Normal rate, regular rhythm, normal heart sounds and intact distal pulses.  Pulmonary/Chest: Effort normal and breath sounds normal. No respiratory distress.  Abdominal: Soft. Bowel sounds are normal. She exhibits no mass. There is no tenderness.  Musculoskeletal: Normal range of motion. She exhibits no edema or tenderness.  Lymphadenopathy:    She has no cervical adenopathy.  Neurological: She is alert and oriented to person, place, and time. No cranial nerve deficit.  Skin: Skin is warm and dry. No rash noted.  Psychiatric: She has a normal mood and affect. Her behavior is normal.  Nursing note and vitals reviewed.   Results for orders placed or performed in visit on 01/10/17  [facility] HIV SCREENING LAB  Result Value Ref Range   [facility] HIV Screening Negative - Validated       Assessment & Plan:   Problem List Items Addressed This Visit    None    Visit Diagnoses    Annual physical exam    -  Primary   Non-fasting labs drawn today. UTD on health maintenance   Relevant Orders   CBC with Differential/Platelet   Comprehensive metabolic panel   Lipid Panel w/o Chol/HDL Ratio   TSH   UA/M w/rflx Culture, Routine   Need for Td vaccine       Relevant Orders   Td vaccine greater than or equal to 7yo preservative free IM (Completed)      Follow up plan: Return in about 1 year (around 03/19/2018) for CPE.   LABORATORY TESTING:  - Pap smear: up to date  IMMUNIZATIONS:   - Tdap: Tetanus vaccination status reviewed: Td vaccination indicated and given today. - Influenza: Refused  PATIENT COUNSELING:   Advised to take 1 mg of  folate supplement per day if capable of pregnancy.   Sexuality: Discussed sexually transmitted diseases, partner selection, use of condoms, avoidance of unintended pregnancy  and contraceptive alternatives.   Advised to avoid cigarette smoking.  I discussed with the patient that most people either abstain from alcohol or drink within safe limits (<=14/week and <=4 drinks/occasion for males, <=7/weeks and <= 3 drinks/occasion for females) and that the risk for alcohol disorders and other health effects rises proportionally with the number of drinks per week and how often a drinker exceeds daily limits.  Discussed cessation/primary prevention of drug use and availability of treatment for  abuse.   Diet: Encouraged to adjust caloric intake to maintain  or achieve ideal body weight, to reduce intake of dietary saturated fat and total fat, to limit sodium intake by avoiding high sodium foods and not adding table salt, and to maintain adequate dietary potassium and calcium preferably from fresh fruits, vegetables, and low-fat dairy products.    stressed the importance of regular exercise  Injury prevention: Discussed safety belts, safety helmets, smoke detector, smoking near bedding or upholstery.   Dental health: Discussed importance of regular tooth brushing, flossing, and dental visits.   NEXT PREVENTATIVE PHYSICAL DUE IN 1 YEAR. Return in about 1 year (around 03/19/2018) for CPE.

## 2017-03-19 NOTE — Patient Instructions (Signed)
Follow up in 1 year.

## 2017-03-20 ENCOUNTER — Other Ambulatory Visit: Payer: Self-pay | Admitting: Family Medicine

## 2017-03-20 DIAGNOSIS — R7989 Other specified abnormal findings of blood chemistry: Secondary | ICD-10-CM

## 2017-03-20 LAB — COMPREHENSIVE METABOLIC PANEL
A/G RATIO: 1.7 (ref 1.2–2.2)
ALK PHOS: 69 IU/L (ref 39–117)
ALT: 7 IU/L (ref 0–32)
AST: 12 IU/L (ref 0–40)
Albumin: 4.4 g/dL (ref 3.5–5.5)
BUN/Creatinine Ratio: 10 (ref 9–23)
BUN: 7 mg/dL (ref 6–20)
Bilirubin Total: 0.2 mg/dL (ref 0.0–1.2)
CALCIUM: 9.3 mg/dL (ref 8.7–10.2)
CO2: 21 mmol/L (ref 20–29)
Chloride: 106 mmol/L (ref 96–106)
Creatinine, Ser: 0.72 mg/dL (ref 0.57–1.00)
GFR calc Af Amer: 131 mL/min/{1.73_m2} (ref >59)
GFR, EST NON AFRICAN AMERICAN: 114 mL/min/{1.73_m2} (ref >59)
Globulin, Total: 2.6 g/dL (ref 1.5–4.5)
Glucose: 62 mg/dL — ABNORMAL LOW (ref 65–99)
POTASSIUM: 4.2 mmol/L (ref 3.5–5.2)
SODIUM: 141 mmol/L (ref 134–144)
Total Protein: 7 g/dL (ref 6.0–8.5)

## 2017-03-20 LAB — LIPID PANEL W/O CHOL/HDL RATIO
CHOLESTEROL TOTAL: 163 mg/dL (ref 100–199)
HDL: 62 mg/dL (ref >39)
LDL Calculated: 84 mg/dL (ref 0–99)
TRIGLYCERIDES: 87 mg/dL (ref 0–149)
VLDL Cholesterol Cal: 17 mg/dL (ref 5–40)

## 2017-03-20 LAB — CBC WITH DIFFERENTIAL/PLATELET
BASOS: 0 %
Basophils Absolute: 0 10*3/uL (ref 0.0–0.2)
EOS (ABSOLUTE): 0.1 10*3/uL (ref 0.0–0.4)
Eos: 1 %
Hematocrit: 38.6 % (ref 34.0–46.6)
Hemoglobin: 12.2 g/dL (ref 11.1–15.9)
IMMATURE GRANULOCYTES: 0 %
Immature Grans (Abs): 0 10*3/uL (ref 0.0–0.1)
LYMPHS ABS: 1.5 10*3/uL (ref 0.7–3.1)
Lymphs: 28 %
MCH: 30.7 pg (ref 26.6–33.0)
MCHC: 31.6 g/dL (ref 31.5–35.7)
MCV: 97 fL (ref 79–97)
MONOS ABS: 0.4 10*3/uL (ref 0.1–0.9)
Monocytes: 7 %
NEUTROS PCT: 64 %
Neutrophils Absolute: 3.5 10*3/uL (ref 1.4–7.0)
PLATELETS: 313 10*3/uL (ref 150–379)
RBC: 3.98 x10E6/uL (ref 3.77–5.28)
RDW: 17.8 % — AB (ref 12.3–15.4)
WBC: 5.5 10*3/uL (ref 3.4–10.8)

## 2017-03-20 LAB — TSH: TSH: 0.253 u[IU]/mL — ABNORMAL LOW (ref 0.450–4.500)

## 2018-03-18 IMAGING — DX DG ANKLE COMPLETE 3+V*L*
3 series · 3 of 3 positions shown · non-contrast
Comparison: None.

CLINICAL DATA: Pain

EXAM:
LEFT ANKLE COMPLETE - 3+ VIEW

[ankle ap]
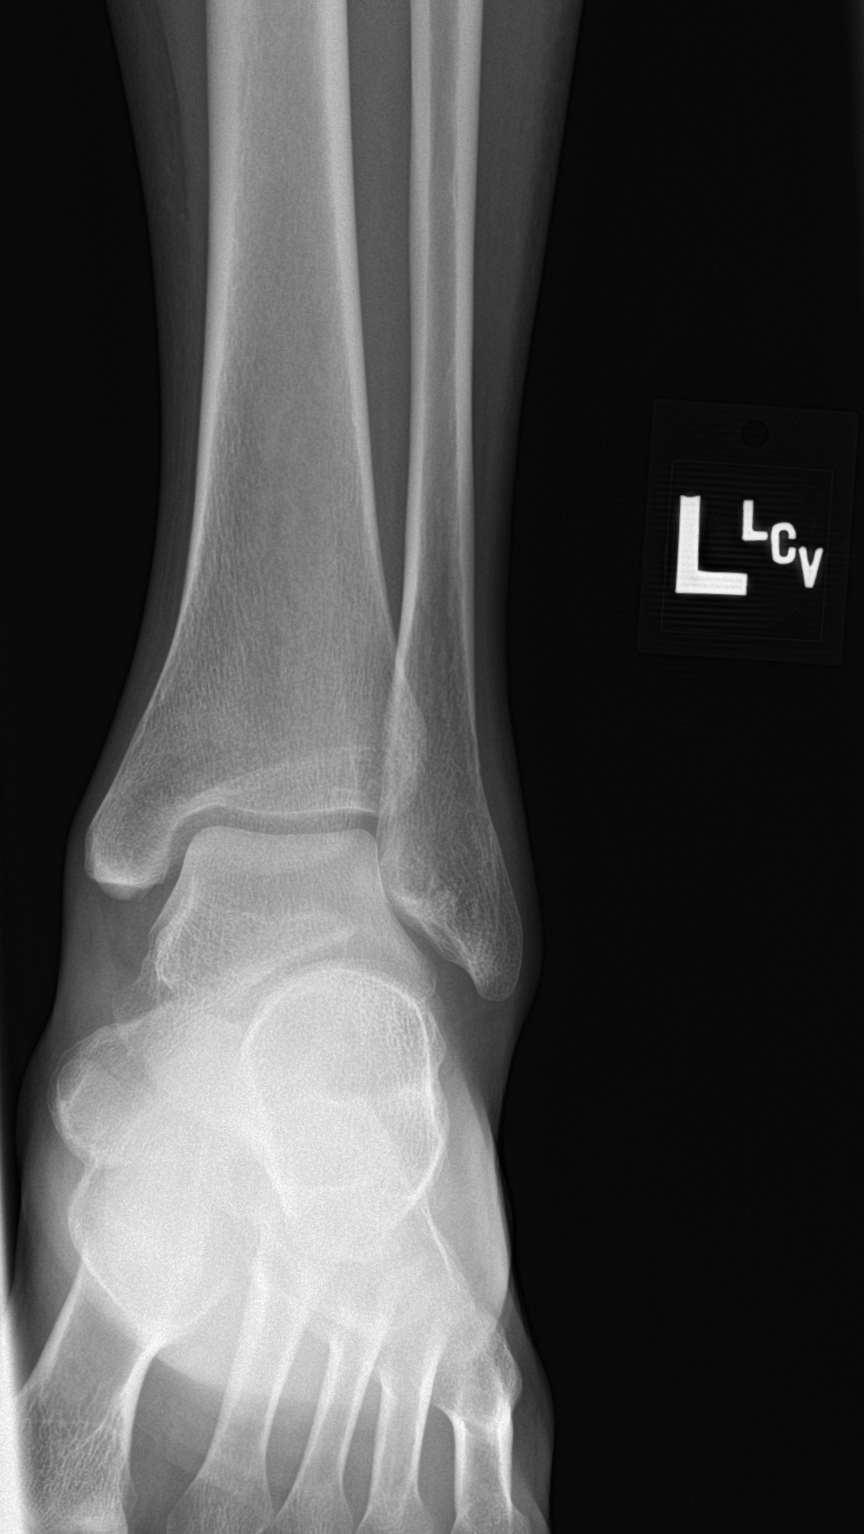

[ankle obl]
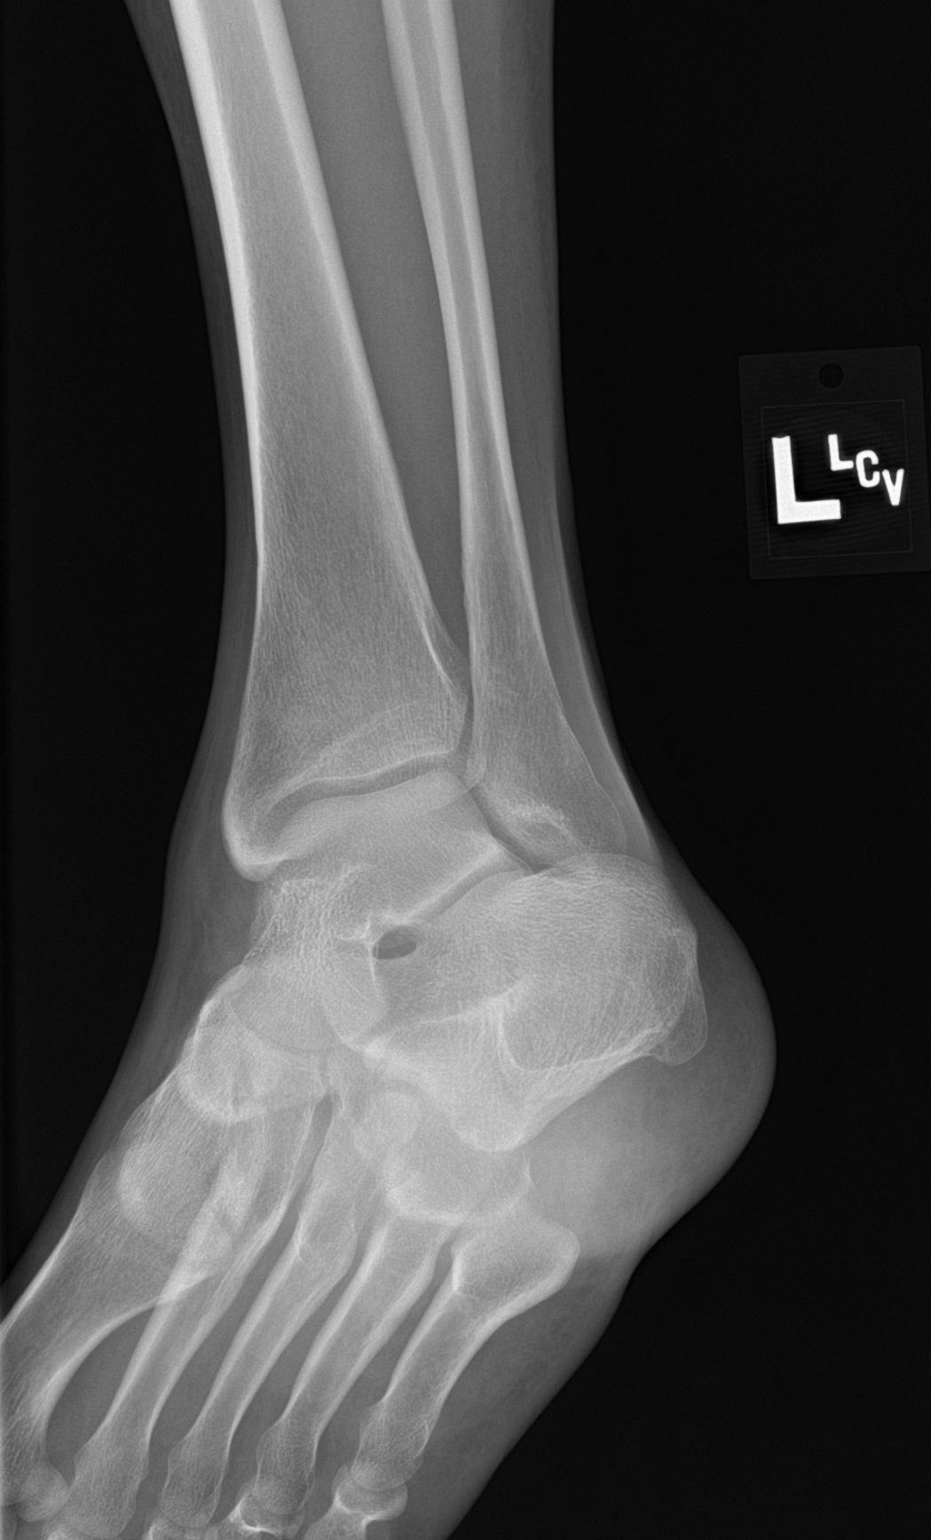

[ankle lat]
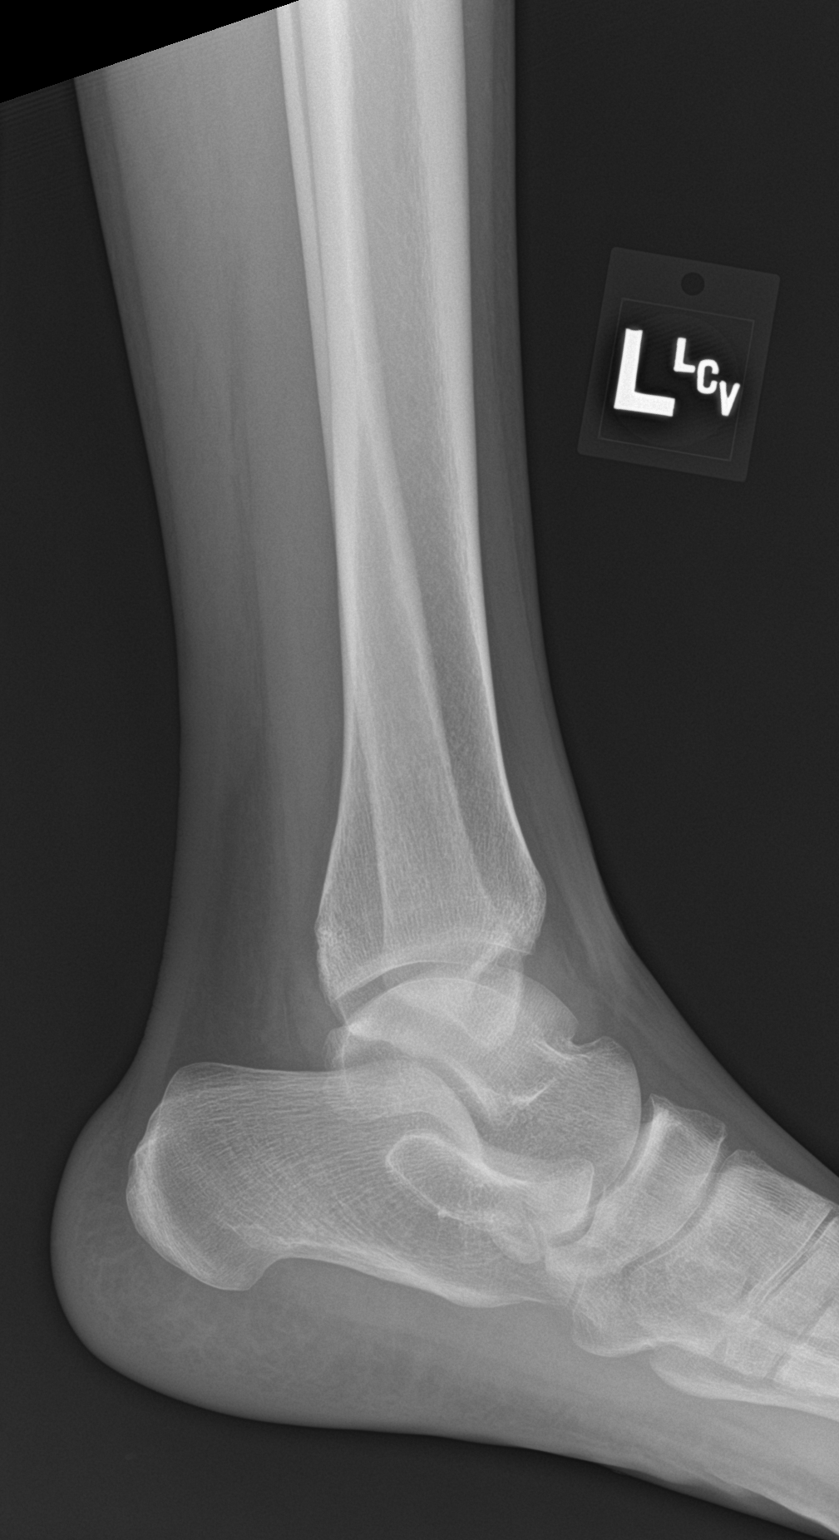

[3 of 3 positions shown; findings below may reference images not displayed]

FINDINGS: Frontal, oblique, and lateral views were obtained. There is mild
soft tissue swelling with focal joint effusion. There is no evident
fracture. No appreciable joint space narrowing or erosion. Ankle
mortise appears intact.
IMPRESSION: Soft tissue swelling with joint effusion. Question ligamentous
injury. No evident fracture. Ankle mortise appears intact.

## 2018-05-10 ENCOUNTER — Emergency Department
Admission: EM | Admit: 2018-05-10 | Discharge: 2018-05-10 | Disposition: A | Discharge status: Home / Self Care | Payer: Medicaid Other | Attending: Emergency Medicine | Admitting: Emergency Medicine

## 2018-05-10 ENCOUNTER — Other Ambulatory Visit: Payer: Self-pay

## 2018-05-10 ENCOUNTER — Encounter: Payer: Self-pay | Admitting: Emergency Medicine

## 2018-05-10 DIAGNOSIS — F17200 Nicotine dependence, unspecified, uncomplicated: Secondary | ICD-10-CM | POA: Diagnosis not present

## 2018-05-10 DIAGNOSIS — R1030 Lower abdominal pain, unspecified: Secondary | ICD-10-CM | POA: Insufficient documentation

## 2018-05-10 DIAGNOSIS — A599 Trichomoniasis, unspecified: Secondary | ICD-10-CM | POA: Diagnosis not present

## 2018-05-10 LAB — CBC
HCT: 36.2 % (ref 36.0–46.0)
Hemoglobin: 11.9 g/dL — ABNORMAL LOW (ref 12.0–15.0)
MCH: 32.7 pg (ref 26.0–34.0)
MCHC: 32.9 g/dL (ref 30.0–36.0)
MCV: 99.5 fL (ref 80.0–100.0)
NRBC: 0 % (ref 0.0–0.2)
Platelets: 301 10*3/uL (ref 150–400)
RBC: 3.64 MIL/uL — ABNORMAL LOW (ref 3.87–5.11)
RDW: 16.1 % — ABNORMAL HIGH (ref 11.5–15.5)
WBC: 9 10*3/uL (ref 4.0–10.5)

## 2018-05-10 LAB — LIPASE, BLOOD: Lipase: 32 U/L (ref 11–51)

## 2018-05-10 LAB — WET PREP, GENITAL
Clue Cells Wet Prep HPF POC: NONE SEEN
Sperm: NONE SEEN
Yeast Wet Prep HPF POC: NONE SEEN

## 2018-05-10 LAB — COMPREHENSIVE METABOLIC PANEL
ALBUMIN: 4.3 g/dL (ref 3.5–5.0)
ALT: 11 U/L (ref 0–44)
AST: 18 U/L (ref 15–41)
Alkaline Phosphatase: 66 U/L (ref 38–126)
Anion gap: 9 (ref 5–15)
BUN: 10 mg/dL (ref 6–20)
CO2: 21 mmol/L — ABNORMAL LOW (ref 22–32)
CREATININE: 0.59 mg/dL (ref 0.44–1.00)
Calcium: 9.1 mg/dL (ref 8.9–10.3)
Chloride: 109 mmol/L (ref 98–111)
GFR calc Af Amer: >60 mL/min (ref >60)
GFR calc non Af Amer: >60 mL/min (ref >60)
Glucose, Bld: 91 mg/dL (ref 70–99)
Potassium: 3.6 mmol/L (ref 3.5–5.1)
Sodium: 139 mmol/L (ref 135–145)
Total Bilirubin: 0.7 mg/dL (ref 0.3–1.2)
Total Protein: 7.7 g/dL (ref 6.5–8.1)

## 2018-05-10 LAB — URINALYSIS, COMPLETE (UACMP) WITH MICROSCOPIC
Bacteria, UA: NONE SEEN
Bilirubin Urine: NEGATIVE
Glucose, UA: NEGATIVE mg/dL
Hgb urine dipstick: NEGATIVE
Ketones, ur: NEGATIVE mg/dL
Nitrite: NEGATIVE
Protein, ur: NEGATIVE mg/dL
Specific Gravity, Urine: 1.02 (ref 1.005–1.030)
pH: 6 (ref 5.0–8.0)

## 2018-05-10 LAB — CHLAMYDIA/NGC RT PCR (ARMC ONLY)
Chlamydia Tr: NOT DETECTED
N gonorrhoeae: NOT DETECTED

## 2018-05-10 LAB — POCT PREGNANCY, URINE: Preg Test, Ur: NEGATIVE

## 2018-05-10 MED ORDER — AZITHROMYCIN 500 MG PO TABS
1000.0000 mg | ORAL_TABLET | Freq: Once | ORAL | Status: AC
  Administered 2018-05-10: 1000 mg via ORAL
  Filled 2018-05-10: qty 2

## 2018-05-10 MED ORDER — CEFTRIAXONE SODIUM 250 MG IJ SOLR
250.0000 mg | INTRAMUSCULAR | Status: DC
  Administered 2018-05-10: 250 mg via INTRAMUSCULAR
  Filled 2018-05-10: qty 250

## 2018-05-10 MED ORDER — METRONIDAZOLE 500 MG PO TABS: 500.0000 mg | ORAL_TABLET | Freq: Two times a day (BID) | ORAL | 0 refills | Status: DC

## 2018-05-10 MED ORDER — LIDOCAINE HCL (PF) 1 % IJ SOLN
INTRAMUSCULAR | Status: AC
  Administered 2018-05-10: 5 mL
  Filled 2018-05-10: qty 5

## 2018-05-10 NOTE — ED Triage Notes (Signed)
Pt to ed with c/o intermittent abd pain x 4 days, denies n/v. But does report diarrhea x 2 in the last 24 hours.

## 2018-05-10 NOTE — ED Provider Notes (Signed)
Patient with trichomonas, will be treated accordingly.   Emily Filbert, MD 05/10/18 [phone]

## 2018-05-10 NOTE — ED Provider Notes (Signed)
[facility] Emergency Department Provider Note   ____________________________________________   First MD Initiated Contact with Patient 05/10/18 1415     (approximate)  I have reviewed the triage vital signs and the nursing notes.   HISTORY  Chief Complaint Abdominal Pain    HPI Taylor [name] is a 33 y.o. female reports about 4 days of intermittent crampy abdominal pain in the lower abdomen.  She had diarrhea twice today.  She has no nausea or vomiting.  She has no fever.  She has a small vaginal discharge but she says she does not suspect any STD or infection at all.   Past Medical History:  Diagnosis Date  . Anemia   . Depression   . History of abnormal cervical Pap smear 2009  . Sleep apnea     Patient Active Problem List   Diagnosis Date Noted  . Anemia 01/16/2017    Past Surgical History:  Procedure Laterality Date  . TUBAL LIGATION      Prior to Admission medications   Not on File    Allergies Patient has no known allergies.  Family History  Problem Relation Age of Onset  . Diabetes Mother   . Diabetes Maternal Grandmother   . Heart disease Maternal Grandmother   . Sickle cell anemia Sister   . Cancer Maternal Aunt        breast    Social History Social History   Tobacco Use  . Smoking status: Current Every Day Smoker    Packs/day: 0.50  . Smokeless tobacco: Never Used  Substance Use Topics  . Alcohol use: No  . Drug use: No    Review of Systems  Constitutional: No fever/chills Eyes: No visual changes. ENT: No sore throat. Cardiovascular: Denies chest pain. Respiratory: Denies shortness of breath. Gastrointestinal: See HPI Genitourinary: Negative for dysuria. Musculoskeletal: Negative for back pain. Skin: Negative for rash. Neurological: Negative for headaches, focal weakness   ____________________________________________   PHYSICAL EXAM:  VITAL SIGNS: ED Triage Vitals [05/10/18 1337]  Enc  Vitals Group     BP 120/63     Pulse Rate 87     Resp 16     Temp 98.1 F (36.7 C)     Temp Source Oral     SpO2 100 %     Weight 102 lb 4.7 oz (46.4 kg)     Height      Head Circumference      Peak Flow      Pain Score 7     Pain Loc      Pain Edu?      Excl. in GC?     Constitutional: Alert and oriented. Well appearing and in no acute distress. Eyes: Conjunctivae are normal.  Head: Atraumatic. Nose: No congestion/rhinnorhea. Mouth/Throat: Mucous membranes are moist.  Oropharynx non-erythematous. Neck: No stridor.  Cardiovascular: Normal rate, regular rhythm. Grossly normal heart sounds.  Good peripheral circulation. Respiratory: Normal respiratory effort.  No retractions. Lungs CTAB. Gastrointestinal: Soft mildly to moderately tender across the lower part of the abdomen from the umbilicus down.  No distention. No abdominal bruits. No CVA tenderness. Musculoskeletal: No lower extremity tenderness nor edema.  No joint effusions. Neurologic:  Normal speech and language. No gross focal neurologic deficits are appreciated. No gait instability. Skin:  Skin is warm, dry and intact. No rash noted. Psychiatric: Mood and affect are normal. Speech and behavior are normal. GYN: Normal perineum.  White discharge in the vagina no cervical motion tenderness  or adnexal tenderness or masses.  Cervix is closed.  Vagina looks normal otherwise.  ____________________________________________   LABS (all labs ordered are listed, but only abnormal results are displayed)  Labs Reviewed  COMPREHENSIVE METABOLIC PANEL - Abnormal; Notable for the following components:      Result Value   CO2 21 (*)    All other components within normal limits  CBC - Abnormal; Notable for the following components:   RBC 3.64 (*)    Hemoglobin 11.9 (*)    RDW 16.1 (*)    All other components within normal limits  URINALYSIS, COMPLETE (UACMP) WITH MICROSCOPIC - Abnormal; Notable for the following components:    Color, Urine YELLOW (*)    APPearance HAZY (*)    Leukocytes,Ua LARGE (*)    All other components within normal limits  WET PREP, GENITAL  CHLAMYDIA/NGC RT PCR ([facility] ONLY)  LIPASE, BLOOD  POC URINE PREG, ED  POCT PREGNANCY, URINE   ____________________________________________  EKG   ____________________________________________  RADIOLOGY  ED MD interpretation:   Official radiology report(s): No results found.  ____________________________________________   PROCEDURES  Procedure(s) performed (including Critical Care):  Procedures   ____________________________________________   INITIAL IMPRESSION / ASSESSMENT AND PLAN / ED COURSE  GC and Chlamydia are pending.  Dr. Mayford Knife will check these and discharge the patient.  Patient will take Pepto-Bismol for the diarrhea if needed.          ____________________________________________   FINAL CLINICAL IMPRESSION(S) / ED DIAGNOSES  Final diagnoses:  Lower abdominal pain     ED Discharge Orders    None       Note:  This document was prepared using Dragon voice recognition software and may include unintentional dictation errors.    Arnaldo Natal, MD 05/10/18 [phone]

## 2020-01-24 ENCOUNTER — Emergency Department: Payer: Medicaid Other

## 2020-01-24 ENCOUNTER — Encounter: Payer: Self-pay | Admitting: Emergency Medicine

## 2020-01-24 ENCOUNTER — Other Ambulatory Visit: Payer: Self-pay

## 2020-01-24 ENCOUNTER — Emergency Department
Admission: EM | Admit: 2020-01-24 | Discharge: 2020-01-24 | Disposition: A | Discharge status: Home / Self Care | Payer: Medicaid Other | Attending: Emergency Medicine | Admitting: Emergency Medicine

## 2020-01-24 DIAGNOSIS — N83209 Unspecified ovarian cyst, unspecified side: Secondary | ICD-10-CM | POA: Insufficient documentation

## 2020-01-24 DIAGNOSIS — R109 Unspecified abdominal pain: Secondary | ICD-10-CM | POA: Diagnosis not present

## 2020-01-24 DIAGNOSIS — F17200 Nicotine dependence, unspecified, uncomplicated: Secondary | ICD-10-CM | POA: Diagnosis not present

## 2020-01-24 DIAGNOSIS — R1030 Lower abdominal pain, unspecified: Secondary | ICD-10-CM

## 2020-01-24 DIAGNOSIS — R1032 Left lower quadrant pain: Secondary | ICD-10-CM | POA: Diagnosis not present

## 2020-01-24 DIAGNOSIS — R9431 Abnormal electrocardiogram [ECG] [EKG]: Secondary | ICD-10-CM | POA: Diagnosis not present

## 2020-01-24 DIAGNOSIS — N83292 Other ovarian cyst, left side: Secondary | ICD-10-CM | POA: Diagnosis not present

## 2020-01-24 LAB — CBC WITH DIFFERENTIAL/PLATELET
Abs Immature Granulocytes: 0.03 10*3/uL (ref 0.00–0.07)
Basophils Absolute: 0 10*3/uL (ref 0.0–0.1)
Basophils Relative: 1 %
Eosinophils Absolute: 0 10*3/uL (ref 0.0–0.5)
Eosinophils Relative: 1 %
HCT: 42.2 % (ref 36.0–46.0)
Hemoglobin: 13.9 g/dL (ref 12.0–15.0)
Immature Granulocytes: 0 %
Lymphocytes Relative: 20 %
Lymphs Abs: 1.6 10*3/uL (ref 0.7–4.0)
MCH: 33.5 pg (ref 26.0–34.0)
MCHC: 32.9 g/dL (ref 30.0–36.0)
MCV: 101.7 fL — ABNORMAL HIGH (ref 80.0–100.0)
Monocytes Absolute: 0.5 10*3/uL (ref 0.1–1.0)
Monocytes Relative: 6 %
Neutro Abs: 6.1 10*3/uL (ref 1.7–7.7)
Neutrophils Relative %: 72 %
Platelets: 281 10*3/uL (ref 150–400)
RBC: 4.15 MIL/uL (ref 3.87–5.11)
RDW: 13.2 % (ref 11.5–15.5)
WBC: 8.4 10*3/uL (ref 4.0–10.5)
nRBC: 0 % (ref 0.0–0.2)

## 2020-01-24 LAB — URINALYSIS, COMPLETE (UACMP) WITH MICROSCOPIC
Bilirubin Urine: NEGATIVE
Glucose, UA: NEGATIVE mg/dL
Ketones, ur: NEGATIVE mg/dL
Leukocytes,Ua: NEGATIVE
Nitrite: NEGATIVE
Protein, ur: NEGATIVE mg/dL
Specific Gravity, Urine: 1.01 (ref 1.005–1.030)
pH: 5 (ref 5.0–8.0)

## 2020-01-24 LAB — COMPREHENSIVE METABOLIC PANEL
ALT: 18 U/L (ref 0–44)
AST: 31 U/L (ref 15–41)
Albumin: 4.5 g/dL (ref 3.5–5.0)
Alkaline Phosphatase: 67 U/L (ref 38–126)
Anion gap: 11 (ref 5–15)
BUN: 12 mg/dL (ref 6–20)
CO2: 22 mmol/L (ref 22–32)
Calcium: 9.1 mg/dL (ref 8.9–10.3)
Chloride: 103 mmol/L (ref 98–111)
Creatinine, Ser: 0.73 mg/dL (ref 0.44–1.00)
GFR, Estimated: >60 mL/min (ref >60)
Glucose, Bld: 175 mg/dL — ABNORMAL HIGH (ref 70–99)
Potassium: 3.9 mmol/L (ref 3.5–5.1)
Sodium: 136 mmol/L (ref 135–145)
Total Bilirubin: 0.6 mg/dL (ref 0.3–1.2)
Total Protein: 8 g/dL (ref 6.5–8.1)

## 2020-01-24 LAB — PREGNANCY, URINE: Preg Test, Ur: NEGATIVE

## 2020-01-24 LAB — LACTIC ACID, PLASMA
Lactic Acid, Venous: 2.5 mmol/L (ref 0.5–1.9)
Lactic Acid, Venous: 0.9 mmol/L (ref 0.5–1.9)

## 2020-01-24 LAB — LIPASE, BLOOD: Lipase: 25 U/L (ref 11–51)

## 2020-01-24 LAB — POC URINE PREG, ED: Preg Test, Ur: NEGATIVE

## 2020-01-24 MED ORDER — KETOROLAC TROMETHAMINE 30 MG/ML IJ SOLN
30.0000 mg | Freq: Once | INTRAMUSCULAR | Status: AC
  Administered 2020-01-24: 30 mg via INTRAVENOUS
  Filled 2020-01-24: qty 1

## 2020-01-24 MED ORDER — HYDROCODONE-ACETAMINOPHEN 5-325 MG PO TABS: 2.0000 | ORAL_TABLET | Freq: Four times a day (QID) | ORAL | 0 refills | Status: DC | PRN

## 2020-01-24 MED ORDER — IOHEXOL 300 MG/ML  SOLN
100.0000 mL | Freq: Once | INTRAMUSCULAR | Status: AC | PRN
  Administered 2020-01-24: 100 mL via INTRAVENOUS

## 2020-01-24 MED ORDER — SODIUM CHLORIDE 0.9 % IV BOLUS
1000.0000 mL | Freq: Once | INTRAVENOUS | Status: AC
  Administered 2020-01-24: 1000 mL via INTRAVENOUS

## 2020-01-24 NOTE — ED Notes (Signed)
Patient taken to CT.

## 2020-01-24 NOTE — ED Notes (Signed)
Date and time results received: 01/24/20 0955  Test: Lactic Acid  Critical Value: 2.5  Name of Provider Notified: Vicente Males, MD  Orders Received? See order for Nacl 0.9% bolus.

## 2020-01-24 NOTE — ED Provider Notes (Addendum)
[facility] Emergency Department Provider Note   ____________________________________________   First MD Initiated Contact with Patient 01/24/20 0914     (approximate)  I have reviewed the triage vital signs and the nursing notes.   HISTORY  Chief Complaint Abdominal Pain    HPI Taylor [name] is a 34 y.o. female with a stated past medical history of anxiety/depression who presents for left lower quadrant abdominal pain.  Patient states that this pain has been present over the last 3 days but has been worsening significantly this morning.  Patient is states that this pain radiates up into her back and denies it radiating into her groin.  Patient denies any dysuria, nausea/vomiting/diarrhea, fever/chills, or weakness/numbness/paresthesias in any extremity         Past Medical History:  Diagnosis Date  . Anemia   . Depression   . History of abnormal cervical Pap smear 2009  . Sleep apnea     Patient Active Problem List   Diagnosis Date Noted  . Anemia 01/16/2017    Past Surgical History:  Procedure Laterality Date  . TUBAL LIGATION      Prior to Admission medications   Medication Sig Start Date End Date Taking? Authorizing Provider  HYDROcodone-acetaminophen (NORCO) 5-325 MG tablet Take 2 tablets by mouth every 6 (six) hours as needed for moderate pain. 01/24/20   Merwyn Katos, MD    Allergies Patient has no known allergies.  Family History  Problem Relation Age of Onset  . Diabetes Mother   . Diabetes Maternal Grandmother   . Heart disease Maternal Grandmother   . Sickle cell anemia Sister   . Cancer Maternal Aunt        breast    Social History Social History   Tobacco Use  . Smoking status: Current Every Day Smoker    Packs/day: 0.50  . Smokeless tobacco: Never Used  Vaping Use  . Vaping Use: Never used  Substance Use Topics  . Alcohol use: No  . Drug use: No    Review of Systems Constitutional: No  fever/chills Eyes: No visual changes. ENT: No sore throat. Cardiovascular: Denies chest pain. Respiratory: Denies shortness of breath. Gastrointestinal: Endorses abdominal pain.  No nausea, no vomiting.  No diarrhea. Genitourinary: Negative for dysuria. Musculoskeletal: Negative for acute arthralgias Skin: Negative for rash. Neurological: Negative for headaches, weakness/numbness/paresthesias in any extremity Psychiatric: Negative for suicidal ideation/homicidal ideation   ____________________________________________   PHYSICAL EXAM:  VITAL SIGNS: ED Triage Vitals  Enc Vitals Group     BP 01/24/20 0909 (!) 142/88     Pulse Rate 01/24/20 0909 89     Resp 01/24/20 0909 16     Temp 01/24/20 0909 97.6 F (36.4 C)     Temp Source 01/24/20 0909 Oral     SpO2 01/24/20 0909 100 %     Weight 01/24/20 0910 106 lb (48.1 kg)     Height 01/24/20 0910 5\' 2"  (1.575 m)     Head Circumference --      Peak Flow --      Pain Score 01/24/20 0910 10     Pain Loc --      Pain Edu? --      Excl. in GC? --    Constitutional: Alert and oriented. Well appearing and in no acute distress. Eyes: Conjunctivae are normal. PERRL. Head: Atraumatic. Nose: No congestion/rhinnorhea. Mouth/Throat: Mucous membranes are moist. Neck: No stridor Cardiovascular: Grossly normal heart sounds.  Good peripheral circulation. Respiratory: Normal  respiratory effort.  No retractions. Gastrointestinal: Soft.  Significant tenderness to palpation in the left lower quadrant of the abdomen with guarding. No distention. Musculoskeletal: No obvious deformities Neurologic:  Normal speech and language. No gross focal neurologic deficits are appreciated. Skin:  Skin is warm and dry. No rash noted. Psychiatric: Mood and affect are normal. Speech and behavior are normal.  ____________________________________________   LABS (all labs ordered are listed, but only abnormal results are displayed)  Labs Reviewed   COMPREHENSIVE METABOLIC PANEL - Abnormal; Notable for the following components:      Result Value   Glucose, Bld 175 (*)    All other components within normal limits  LACTIC ACID, PLASMA - Abnormal; Notable for the following components:   Lactic Acid, Venous 2.5 (*)    All other components within normal limits  CBC WITH DIFFERENTIAL/PLATELET - Abnormal; Notable for the following components:   MCV 101.7 (*)    All other components within normal limits  URINALYSIS, COMPLETE (UACMP) WITH MICROSCOPIC - Abnormal; Notable for the following components:   Color, Urine YELLOW (*)    APPearance HAZY (*)    Hgb urine dipstick SMALL (*)    Bacteria, UA RARE (*)    All other components within normal limits  LACTIC ACID, PLASMA  LIPASE, BLOOD  PREGNANCY, URINE  POC URINE PREG, ED   RADIOLOGY  ED MD interpretation: CT of the abdomen pelvis with contrast shows swirl in the mesentery concerning for possible internal hernia.  Also shows evidence of recently ruptured ovarian cyst with free fluid in the cul-de-sac  Official radiology report(s): CT Abdomen Pelvis W Contrast  Result Date: 01/24/2020 CLINICAL DATA:  Left abdominal pain EXAM: CT ABDOMEN AND PELVIS WITH CONTRAST TECHNIQUE: Multidetector CT imaging of the abdomen and pelvis was performed using the standard protocol following bolus administration of intravenous contrast. CONTRAST:  OMNIPAQUE IOHEXOL 300 MG/ML  SOLN COMPARISON:  None. FINDINGS: Lower chest: Lung bases clear. Hepatobiliary: No focal liver lesions are appreciable. Gallbladder upper normal in size without wall thickening. No appreciable biliary duct dilatation. Pancreas: There is no pancreatic mass or inflammatory focus. Spleen: No splenic lesions are evident. Adrenals/Urinary Tract: Adrenals bilaterally appear normal. Kidneys bilaterally show no evident mass or hydronephrosis on either side. There is no renal or ureteral calculus on either side. Urinary bladder is midline  with wall thickness within normal limits. Stomach/Bowel: Moderate stool is noted in the colon. There is no appreciable bowel wall or mesenteric thickening. No evident bowel obstruction. Terminal ileum appears normal. There is no evident free air or portal venous air. Note that in the upper pelvis in the midline, there is a somewhat swirl like appearance in the mesentery, best seen on coronal slices 27 through 33 series 6, suspicious for an internal hernia which does not contain bowel currently. Vascular/Lymphatic: No abdominal aortic aneurysm. No arterial vascular lesions are evident. Major venous structures appear patent. There is no evident adenopathy in the abdomen or pelvis. Reproductive: The uterus is retroverted. No adnexal mass is seen. A small amount of fluid is noted in dependent portion of the pelvis, however. Other: No appendiceal region inflammation is evident. Diminutive appendix present. No abscess evident in the abdomen pelvis. No ascites beyond mild fluid in the cul-de-sac region. Musculoskeletal: No blastic or lytic bone lesions. No intramuscular or abdominal wall lesions are evident. IMPRESSION: 1. Somewhat swirl like appearance in the mesentery of the midline upper pelvis which is not containing bowel. This area appears concerning for a focal  internal hernia without bowel obstruction currently. This is a finding that potentially may place bowel at increased risk for obstruction or volvulus. Surgical referral in this regard may well be advisable. 2. Mild fluid in the cul-de-sac which may represent recent ovarian cyst rupture. No adnexal mass seen currently. Uterus is retroverted. 3. Appendix region appears unremarkable. No abscess in the abdomen or pelvis. Currently no bowel obstruction or bowel wall thickening evident. 4. No evident renal or ureteral calculus. No hydronephrosis. Urinary bladder wall thickness normal. Electronically Signed   By: Bretta Bang III M.D.   On: 01/24/2020 11:18     ____________________________________________   PROCEDURES  Procedure(s) performed (including Critical Care):  .1-3 Lead EKG Interpretation Performed by: Merwyn Katos, MD Authorized by: Merwyn Katos, MD     Interpretation: normal     ECG rate:  78   ECG rate assessment: normal     Rhythm: sinus rhythm     Ectopy: none     Conduction: normal       ____________________________________________   INITIAL IMPRESSION / ASSESSMENT AND PLAN / ED COURSE  As part of my medical decision making, I reviewed the following data within the electronic MEDICAL RECORD NUMBER Nursing notes reviewed and incorporated, Labs reviewed, EKG interpreted, Old chart reviewed, Radiograph reviewed and Notes from prior ED visits reviewed and incorporated        Patients symptoms not typical for emergent causes of abdominal pain such as, but not limited to, appendicitis, abdominal aortic aneurysm, surgical biliary disease, pancreatitis, SBO, mesenteric ischemia, serious intra-abdominal bacterial illness. Presentation also not typical of gynecologic emergencies such as TOA, Ovarian Torsion, PID. Not Ectopic.  However, patient does have evidence of a ruptured ovarian cyst on CT which likely explains her pain Doubt atypical ACS.  Pt tolerating PO. Disposition: Patient will be discharged with strict return precautions and follow up with primary MD within 12-24 hours for further evaluation.  Patient also given results of the mesenteric swirling seen on CT scan and provided with follow-up with our on-call general surgeon Dr. Tonna Boehringer. Patient understands that this still may have an early presentation of an emergent medical condition such as appendicitis that will require a recheck.      ____________________________________________   FINAL CLINICAL IMPRESSION(S) / ED DIAGNOSES  Final diagnoses:  Ruptured ovarian cyst  Lower abdominal pain     ED Discharge Orders         Ordered     HYDROcodone-acetaminophen (NORCO) 5-325 MG tablet  Every 6 hours PRN        01/24/20 1216           Note:  This document was prepared using Dragon voice recognition software and may include unintentional dictation errors.   Merwyn Katos, MD 01/24/20 1329    Merwyn Katos, MD 01/24/20 1330

## 2020-01-24 NOTE — ED Triage Notes (Signed)
Pt to ED via POV c/o LLQ abdominal pain that radiates around her left side and into her back. Pt states that the pain started Thursday or Friday but the pain got worse today. Pt denies urinary symptoms, pt denies N/V/D. Pt denies fever or chills. Pt is in NAD.

## 2020-01-25 ENCOUNTER — Telehealth: Payer: Self-pay

## 2020-01-25 NOTE — Telephone Encounter (Signed)
Transition Care Management Follow-up Telephone Call  Date of discharge and from where: 01/24/2020 [facility] ED  How have you been since you were released from the hospital? Doing pretty well.   Any questions or concerns? No  Items Reviewed:  Did the pt receive and understand the discharge instructions provided? Yes   Medications obtained and verified? Yes   Other? No   Any new allergies since your discharge? Yes   Dietary orders reviewed? Yes  Do you have support at home? Yes   Home Care and Equipment/Supplies: Were home health services ordered? Not applicable If so, what is the name of the agency? n/a  Has the agency set up a time to come to the patient's home? not applicable Were any new equipment or medical supplies ordered?  No What is the name of the medical supply agency? n/a Were you able to get the supplies/equipment? not applicable Do you have any questions related to the use of the equipment or supplies? No  Functional Questionnaire: (I = Independent and D = Dependent) ADLs: I  Bathing/Dressing- I  Meal Prep- I  Eating- I  Maintaining continence- I  Transferring/Ambulation- I  Managing Meds- I  Follow up appointments reviewed:   PCP Hospital f/u appt confirmed? No    Specialist Hospital f/u appt confirmed? No  Patient stated she is going to call Dr. Valeda [facility] now to schedule an appointment.   Are transportation arrangements needed? No   If their condition worsens, is the pt aware to call PCP or go to the Emergency Dept.? Yes  Was the patient provided with contact information for the PCP's office or ED? Yes  Was to pt encouraged to call back with questions or concerns? Yes

## 2020-01-26 ENCOUNTER — Encounter: Payer: Self-pay | Admitting: Nurse Practitioner

## 2020-01-26 ENCOUNTER — Ambulatory Visit (INDEPENDENT_AMBULATORY_CARE_PROVIDER_SITE_OTHER): Payer: Medicaid Other | Admitting: Nurse Practitioner

## 2020-01-26 ENCOUNTER — Other Ambulatory Visit: Payer: Self-pay

## 2020-01-26 VITALS — BP 130/82 | HR 94 | Temp 98.3°F | Wt 107.8 lb

## 2020-01-26 DIAGNOSIS — N83209 Unspecified ovarian cyst, unspecified side: Secondary | ICD-10-CM | POA: Diagnosis not present

## 2020-01-26 DIAGNOSIS — R7309 Other abnormal glucose: Secondary | ICD-10-CM | POA: Diagnosis not present

## 2020-01-26 DIAGNOSIS — Z113 Encounter for screening for infections with a predominantly sexual mode of transmission: Secondary | ICD-10-CM

## 2020-01-26 DIAGNOSIS — K469 Unspecified abdominal hernia without obstruction or gangrene: Secondary | ICD-10-CM

## 2020-01-26 HISTORY — DX: Unspecified abdominal hernia without obstruction or gangrene: K46.9

## 2020-01-26 NOTE — Patient Instructions (Signed)
Ovarian Cyst An ovarian cyst is a fluid-filled sac on an ovary. The ovaries are organs that make eggs in women. Most ovarian cysts go away on their own and are not cancerous (are benign). Some cysts need treatment. Follow these instructions at home:  Take over-the-counter and prescription medicines only as told by your doctor.  Do not drive or use heavy machinery while taking prescription pain medicine.  Get pelvic exams and Pap tests as often as told by your doctor.  Return to your normal activities as told by your doctor. Ask your doctor what activities are safe for you.  Do not use any products that contain nicotine or tobacco, such as cigarettes and e-cigarettes. If you need help quitting, ask your doctor.  Keep all follow-up visits as told by your doctor. This is important. Contact a doctor if:  Your periods are: ? Late. ? Irregular. ? Painful.   Your periods stop.  You have pelvic pain that does not go away.  You have pressure on your bladder.  You have trouble making your bladder empty when you pee (urinate).  You have pain during sex.  You have any of the following in your belly (abdomen): ? A feeling of fullness. ? Pressure. ? Discomfort. ? Pain that does not go away. ? Swelling.  You feel sick most of the time.  You have trouble pooping (have constipation).  You are not as hungry as usual (you lose your appetite).  You get very bad acne.  You start to have more hair on your body and face.  You are gaining weight or losing weight without changing your exercise and eating habits.  You think you may be pregnant. Get help right away if:  You have belly pain that is very bad or gets worse.  You cannot eat or drink without throwing up (vomiting).  You suddenly get a fever.  Your period is a lot heavier than usual. This information is not intended to replace advice given to you by your health care provider. Make sure you discuss any questions you have  with your health care provider. Document Revised: 02/08/2017 Document Reviewed: 07/31/2015 Elsevier Patient Education  2020 [employer]  

## 2020-01-26 NOTE — Assessment & Plan Note (Signed)
Noted to be possible on recent CT scan, she has an upcoming visit scheduled with general surgery next month to further discuss.

## 2020-01-26 NOTE — Assessment & Plan Note (Signed)
Acute and improving, but some mild ongoing discomfort to LLQ.  Recommend continue use of Tylenol as needed at home.  Obtain CBC, CMP, and TSH today on labs.  Will place referral to GYN for any further recommendations and assessment due to mild ongoing discomfort being present.  For worsening pain, bleeding, dizziness then recommend patient immediately be seen in ER.

## 2020-01-26 NOTE — Progress Notes (Addendum)
BP 130/82   Pulse 94   Temp 98.3 F (36.8 C)   Wt 107 lb 12.8 oz (48.9 kg)   LMP 01/04/2020 Comment: neg preg  SpO2 100%   BMI 19.72 kg/m    Subjective:    Patient ID: Taylor [name], female    DOB: 1985-03-31, 33 y.o.   MRN: [id number]  HPI: Taylor [name] is a 34 y.o. female  Chief Complaint  Patient presents with  . Ovarian Cyst    pt here to follow up on ED visit on 11/14, pt was told her pain was from a ruptured ovarian cyst. Pt states her pain is a lot better now, takes tylenol as needed for pain    ER FOLLOW UP Seen in ER on 01/24/20 for LLQ pain that was worsening over a 3 day period. Imaging noted following: "IMPRESSION: 1. Somewhat swirl like appearance in the mesentery of the midline upper pelvis which is not containing bowel. This area appears concerning for a focal internal hernia without bowel obstruction currently. This is a finding that potentially may place bowel at increased risk for obstruction or volvulus. Surgical referral in this regard may well be advisable. 2. Mild fluid in the cul-de-sac which may represent recent ovarian cyst rupture. No adnexal mass seen currently. Uterus is retroverted. 3. Appendix region appears unremarkable. No abscess in the abdomen or pelvis. Currently no bowel obstruction or bowel wall thickening evident. 4. No evident renal or ureteral calculus. No hydronephrosis. Urinary bladder wall thickness normal. "  She was provided follow-up for general surgeon Dr. Tonna Boehringer due to hernia being noted -- she has an appointment next month.  Treated for ovarian cyst rupture and sent home with Norco.  Has been taking Tylenol since discharge for discomfort and reports pain improving and is a 3, still to left lower quadrant.  She has 3 children at home -- is G3 P3, carried all to term.  Has never had a ovarian cyst before.  Endorses regular menstrual cycles, lasting 5 days.  LMP 01/04/20.  For first 3 days she does endorse heavier flow.  Does have a  history of STDs, last February 2020 with trich.  Currently is sexually active, reports monogamous relationship. Time since discharge: 2 days Hospital/facility: [facility] Diagnosis: ovarian cyst rupture Procedures/tests: CT scan and labs -- labs did note glucose 175, no anemia Consultants: none New medications: Norco Discharge instructions:  Follow-up with PCP Status: better  Relevant past medical, surgical, family and social history reviewed and updated as indicated. Interim medical history since our last visit reviewed. Allergies and medications reviewed and updated.  Review of Systems  Constitutional: Negative.   Respiratory: Negative.   Cardiovascular: Negative.   Gastrointestinal: Negative for abdominal distention, abdominal pain, constipation, diarrhea, nausea and vomiting.  Genitourinary: Negative.   Neurological: Negative.   Psychiatric/Behavioral: Negative.     Per HPI unless specifically indicated above     Objective:    BP 130/82   Pulse 94   Temp 98.3 F (36.8 C)   Wt 107 lb 12.8 oz (48.9 kg)   LMP 01/04/2020 Comment: neg preg  SpO2 100%   BMI 19.72 kg/m   Wt Readings from Last 3 Encounters:  01/26/20 107 lb 12.8 oz (48.9 kg)  01/24/20 106 lb (48.1 kg)  05/10/18 102 lb 4.7 oz (46.4 kg)    Physical Exam Vitals and nursing note reviewed.  Constitutional:      General: She is awake. She is not in acute distress.    Appearance:  She is well-developed and well-groomed. She is not ill-appearing.  HENT:     Head: Normocephalic.     Right Ear: Hearing normal.     Left Ear: Hearing normal.     Nose: Nose normal.     Mouth/Throat:     Mouth: Mucous membranes are moist.  Eyes:     General: Lids are normal.        Right eye: No discharge.        Left eye: No discharge.     Conjunctiva/sclera: Conjunctivae normal.     Pupils: Pupils are equal, round, and reactive to light.  Neck:     Thyroid: Thyromegaly present.     Vascular: No carotid bruit or JVD.    Cardiovascular:     Rate and Rhythm: Normal rate and regular rhythm.     Heart sounds: Normal heart sounds. No murmur heard.  No gallop.   Pulmonary:     Effort: Pulmonary effort is normal.     Breath sounds: Normal breath sounds.  Abdominal:     General: Bowel sounds are normal. There is no distension.     Palpations: Abdomen is soft. There is no hepatomegaly or splenomegaly.     Tenderness: There is abdominal tenderness in the left lower quadrant. There is no right CVA tenderness or left CVA tenderness.     Hernia: No hernia is present.     Comments: Mild tenderness noted on palpation to LLQ, but no guarding.  Musculoskeletal:     Cervical back: Normal range of motion and neck supple.     Right lower leg: No edema.     Left lower leg: No edema.  Lymphadenopathy:     Cervical: No cervical adenopathy.  Skin:    General: Skin is warm and dry.  Neurological:     Mental Status: She is alert and oriented to person, place, and time.  Psychiatric:        Attention and Perception: Attention normal.        Mood and Affect: Mood normal.        Behavior: Behavior normal. Behavior is cooperative.        Thought Content: Thought content normal.        Judgment: Judgment normal.     Results for orders placed or performed during the hospital encounter of 01/24/20  Comprehensive metabolic panel  Result Value Ref Range   Sodium 136 135 - 145 mmol/L   Potassium 3.9 3.5 - 5.1 mmol/L   Chloride 103 98 - 111 mmol/L   CO2 22 22 - 32 mmol/L   Glucose, Bld 175 (H) 70 - 99 mg/dL   BUN 12 6 - 20 mg/dL   Creatinine, Ser 5.17 0.44 - 1.00 mg/dL   Calcium 9.1 8.9 - 61.6 mg/dL   Total Protein 8.0 6.5 - 8.1 g/dL   Albumin 4.5 3.5 - 5.0 g/dL   AST 31 15 - 41 U/L   ALT 18 0 - 44 U/L   Alkaline Phosphatase 67 38 - 126 U/L   Total Bilirubin 0.6 0.3 - 1.2 mg/dL   GFR, Estimated >07 >37 mL/min   Anion gap 11 5 - 15  Lactic acid, plasma  Result Value Ref Range   Lactic Acid, Venous 2.5 (HH) 0.5 -  1.9 mmol/L  Lactic acid, plasma  Result Value Ref Range   Lactic Acid, Venous 0.9 0.5 - 1.9 mmol/L  Lipase, blood  Result Value Ref Range   Lipase 25 11 - 51  U/L  CBC with Differential  Result Value Ref Range   WBC 8.4 4.0 - 10.5 K/uL   RBC 4.15 3.87 - 5.11 MIL/uL   Hemoglobin 13.9 12.0 - 15.0 g/dL   HCT 40.942.2 36 - 46 %   MCV 101.7 (H) 80.0 - 100.0 fL   MCH 33.5 26.0 - 34.0 pg   MCHC 32.9 30.0 - 36.0 g/dL   RDW 81.113.2 91.411.5 - 78.215.5 %   Platelets 281 150 - 400 K/uL   nRBC 0.0 0.0 - 0.2 %   Neutrophils Relative % 72 %   Neutro Abs 6.1 1.7 - 7.7 K/uL   Lymphocytes Relative 20 %   Lymphs Abs 1.6 0.7 - 4.0 K/uL   Monocytes Relative 6 %   Monocytes Absolute 0.5 0.1 - 1.0 K/uL   Eosinophils Relative 1 %   Eosinophils Absolute 0.0 0.0 - 0.5 K/uL   Basophils Relative 1 %   Basophils Absolute 0.0 0.0 - 0.1 K/uL   Immature Granulocytes 0 %   Abs Immature Granulocytes 0.03 0.00 - 0.07 K/uL  Pregnancy, urine  Result Value Ref Range   Preg Test, Ur NEGATIVE NEGATIVE  Urinalysis, Complete w Microscopic  Result Value Ref Range   Color, Urine YELLOW (A) YELLOW   APPearance HAZY (A) CLEAR   Specific Gravity, Urine 1.010 1.005 - 1.030   pH 5.0 5.0 - 8.0   Glucose, UA NEGATIVE NEGATIVE mg/dL   Hgb urine dipstick SMALL (A) NEGATIVE   Bilirubin Urine NEGATIVE NEGATIVE   Ketones, ur NEGATIVE NEGATIVE mg/dL   Protein, ur NEGATIVE NEGATIVE mg/dL   Nitrite NEGATIVE NEGATIVE   Leukocytes,Ua NEGATIVE NEGATIVE   RBC / HPF 0-5 0 - 5 RBC/hpf   WBC, UA 0-5 0 - 5 WBC/hpf   Bacteria, UA RARE (A) NONE SEEN   Squamous Epithelial / LPF 6-10 0 - 5  P[facility] urine preg, [facility])  Result Value Ref Range   Preg Test, Ur NEGATIVE NEGATIVE      Assessment & Plan:   Problem List Items Addressed This Visit      Endocrine   Rupture of ovarian cyst - Primary    Acute and improving, but some mild ongoing discomfort to LLQ.  Recommend continue use of Tylenol as needed at home.  Obtain CBC, CMP, and TSH  today on labs.  Will place referral to GYN for any further recommendations and assessment due to mild ongoing discomfort being present.  For worsening pain, bleeding, dizziness then recommend patient immediately be seen in ER.      Relevant Orders   CBC with Differential/Platelet   Comprehensive metabolic panel   TSH   Ambulatory referral to Gynecology     Other   Hernia, abdominal    Noted to be possible on recent CT scan, she has an upcoming visit scheduled with general surgery next month to further discuss.       Other Visit Diagnoses    Elevated glucose       Noted on hospital labs, suspect related to IV fluids, but will check A1C today due to recent ovarian cyst rupture.   Relevant Orders   Comprehensive metabolic panel   HgB A1c   Screen for STD (sexually transmitted disease)       STD screening today after discussion with patient to include RPR, HIV, GC/Chlam, and HSV testing.   Relevant Orders   HIV Antibody (routine testing w rflx)   HSV(herpes simplex vrs) 1+2 ab-IgG   RPR  GC/Chlamydia Probe Amp       Follow up plan: Return if symptoms worsen or fail to improve.

## 2020-01-27 ENCOUNTER — Telehealth: Payer: Self-pay | Admitting: Nurse Practitioner

## 2020-01-27 DIAGNOSIS — E0590 Thyrotoxicosis, unspecified without thyrotoxic crisis or storm: Secondary | ICD-10-CM

## 2020-01-27 LAB — CBC WITH DIFFERENTIAL/PLATELET
Basophils Absolute: 0 10*3/uL (ref 0.0–0.2)
Basos: 0 %
EOS (ABSOLUTE): 0 10*3/uL (ref 0.0–0.4)
Eos: 0 %
Hematocrit: 40 % (ref 34.0–46.6)
Hemoglobin: 13.1 g/dL (ref 11.1–15.9)
Immature Grans (Abs): 0 10*3/uL (ref 0.0–0.1)
Immature Granulocytes: 0 %
Lymphocytes Absolute: 2 10*3/uL (ref 0.7–3.1)
Lymphs: 29 %
MCH: 32.9 pg (ref 26.6–33.0)
MCHC: 32.8 g/dL (ref 31.5–35.7)
MCV: 101 fL — ABNORMAL HIGH (ref 79–97)
Monocytes Absolute: 0.4 10*3/uL (ref 0.1–0.9)
Monocytes: 6 %
Neutrophils Absolute: 4.6 10*3/uL (ref 1.4–7.0)
Neutrophils: 65 %
Platelets: 257 10*3/uL (ref 150–450)
RBC: 3.98 x10E6/uL (ref 3.77–5.28)
RDW: 11.9 % (ref 11.7–15.4)
WBC: 7.1 10*3/uL (ref 3.4–10.8)

## 2020-01-27 LAB — COMPREHENSIVE METABOLIC PANEL
ALT: 10 IU/L (ref 0–32)
AST: 14 IU/L (ref 0–40)
Albumin/Globulin Ratio: 2.3 — ABNORMAL HIGH (ref 1.2–2.2)
Albumin: 4.6 g/dL (ref 3.8–4.8)
Alkaline Phosphatase: 67 IU/L (ref 44–121)
BUN/Creatinine Ratio: 11 (ref 9–23)
BUN: 7 mg/dL (ref 6–20)
Bilirubin Total: 0.2 mg/dL (ref 0.0–1.2)
CO2: 21 mmol/L (ref 20–29)
Calcium: 9.4 mg/dL (ref 8.7–10.2)
Chloride: 107 mmol/L — ABNORMAL HIGH (ref 96–106)
Creatinine, Ser: 0.62 mg/dL (ref 0.57–1.00)
GFR calc Af Amer: 138 mL/min/{1.73_m2} (ref >59)
GFR calc non Af Amer: 120 mL/min/{1.73_m2} (ref >59)
Globulin, Total: 2 g/dL (ref 1.5–4.5)
Glucose: 85 mg/dL (ref 65–99)
Potassium: 4.4 mmol/L (ref 3.5–5.2)
Sodium: 139 mmol/L (ref 134–144)
Total Protein: 6.6 g/dL (ref 6.0–8.5)

## 2020-01-27 LAB — HEMOGLOBIN A1C
Est. average glucose Bld gHb Est-mCnc: 100 mg/dL
Hgb A1c MFr Bld: 5.1 % (ref 4.8–5.6)

## 2020-01-27 LAB — RPR: RPR Ser Ql: NONREACTIVE

## 2020-01-27 LAB — HSV(HERPES SIMPLEX VRS) I + II AB-IGG
HSV 1 Glycoprotein G Ab, IgG: 57.1 index — ABNORMAL HIGH (ref 0.00–0.90)
HSV 2 IgG, Type Spec: <0.91 index (ref 0.00–0.90)

## 2020-01-27 LAB — HIV ANTIBODY (ROUTINE TESTING W REFLEX): HIV Screen 4th Generation wRfx: NONREACTIVE

## 2020-01-27 LAB — TSH: TSH: 0.362 u[IU]/mL — ABNORMAL LOW (ref 0.450–4.500)

## 2020-01-27 NOTE — Telephone Encounter (Signed)
Spoke to patient on telephone and reviewed recent labs.  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  She has never had a cold sore, discussed with her these findings.  Her TSH remains on low side, which it was two years ago and noted thyromegaly on exam.  She denies any symptoms of hyperthyroid.  Educated her on this.  Will plan for repeat labs outpatient in 4 weeks and obtain imaging of thyroid to further assess.  If ongoing hyperthyroid noted will refer to endocrinology for further guidance.  Made her aware of this plan and she agrees with plan.

## 2020-01-27 NOTE — Telephone Encounter (Signed)
Pt has apt on 02/23/2020 pt verbalized understanding.

## 2020-01-29 LAB — GC/CHLAMYDIA PROBE AMP
Chlamydia trachomatis, NAA: NEGATIVE
Neisseria Gonorrhoeae by PCR: NEGATIVE

## 2020-01-29 NOTE — Progress Notes (Signed)
Contacted via MyChart  Good morning Taylor [name] -- your gonorrhea and chlamydia testing has returned and is negative.  Great news.  Have a wonderful day!!

## 2020-02-03 ENCOUNTER — Ambulatory Visit: Payer: Medicaid Other

## 2020-02-10 ENCOUNTER — Telehealth: Payer: Self-pay | Admitting: Nurse Practitioner

## 2020-02-10 ENCOUNTER — Ambulatory Visit: Payer: Medicaid Other

## 2020-02-10 NOTE — Telephone Encounter (Signed)
Kim, from Ultrasound dept at [facility], calling stating that the pt did not show up to her ultrasound today for her thyroid. Please advise.     [phone]

## 2020-02-10 NOTE — Telephone Encounter (Signed)
Routing to provider  

## 2020-02-10 NOTE — Telephone Encounter (Signed)
Please call patient and check to ensure she new about visit today, alert her I do recommend she have this performed to further assess thyroid.  Would like her to reschedule.

## 2020-02-10 NOTE — Telephone Encounter (Signed)
Patient notified

## 2020-02-17 ENCOUNTER — Ambulatory Visit: Payer: Medicaid Other

## 2020-02-18 ENCOUNTER — Other Ambulatory Visit: Payer: Self-pay

## 2020-02-18 ENCOUNTER — Ambulatory Visit
Admission: RE | Admit: 2020-02-18 | Discharge: 2020-02-18 | Disposition: A | Discharge status: Home / Self Care | Payer: Medicaid Other | Source: Ambulatory Visit | Attending: Nurse Practitioner | Admitting: Nurse Practitioner

## 2020-02-18 DIAGNOSIS — E0590 Thyrotoxicosis, unspecified without thyrotoxic crisis or storm: Secondary | ICD-10-CM | POA: Diagnosis not present

## 2020-02-18 DIAGNOSIS — E010 Iodine-deficiency related diffuse (endemic) goiter: Secondary | ICD-10-CM | POA: Diagnosis not present

## 2020-02-18 NOTE — Progress Notes (Signed)
Contacted via MyChart  -- please schedule lab visit only   Good evening Taylor [name], your ultrasound has returned.  It does show some mild enlargement.  I would like you to return for your repeat thyroid blood work so we can reassess and ensure no hyperthyroid as last TSH was on lower side.  Please ensure to scheduled this lab appointment.  Thank you.  If continues to be on low side we may send you to endocrinology. Keep being awesome!!  Thank you for allowing me to participate in your care. Kindest regards, Oden Lindaman

## 2020-02-23 ENCOUNTER — Encounter: Payer: Medicaid Other | Admitting: Obstetrics and Gynecology

## 2020-02-23 ENCOUNTER — Other Ambulatory Visit: Payer: Medicaid Other

## 2020-02-24 ENCOUNTER — Other Ambulatory Visit: Payer: Self-pay

## 2020-02-24 ENCOUNTER — Other Ambulatory Visit: Payer: Medicaid Other

## 2020-02-24 DIAGNOSIS — E0590 Thyrotoxicosis, unspecified without thyrotoxic crisis or storm: Secondary | ICD-10-CM

## 2020-02-25 ENCOUNTER — Other Ambulatory Visit: Payer: Self-pay | Admitting: Nurse Practitioner

## 2020-02-25 DIAGNOSIS — E0590 Thyrotoxicosis, unspecified without thyrotoxic crisis or storm: Secondary | ICD-10-CM

## 2020-02-25 LAB — TSH: TSH: 0.226 u[IU]/mL — ABNORMAL LOW (ref 0.450–4.500)

## 2020-02-25 LAB — T4, FREE: Free T4: 0.91 ng/dL (ref 0.82–1.77)

## 2020-02-25 NOTE — Progress Notes (Signed)
Contacted via MyChart -- please call and notify too as not sure she will respond  Good morning Taylor [name], your labs have returned.  TSH remains on low side and Free T4 low normal.  Based on this ongoing low level and mild increase in thyroid size on ultrasound I would like to send you to endocrinology for further assessment for hyperthyroidism.  Would like their recommendations.  Are you okay with me placing this referral, please let me know and I will place order.  Thank you. Keep being awesome!!  Thank you for allowing me to participate in your care. Kindest regards, Jesselee Poth

## 2020-02-25 NOTE — Progress Notes (Signed)
Referral to Endo for low TSH levels.

## 2020-03-14 ENCOUNTER — Other Ambulatory Visit: Payer: Self-pay

## 2020-03-14 ENCOUNTER — Other Ambulatory Visit (HOSPITAL_COMMUNITY)
Admission: RE | Admit: 2020-03-14 | Discharge: 2020-03-14 | Disposition: A | Discharge status: Home / Self Care | Payer: Medicaid Other | Source: Ambulatory Visit | Attending: Obstetrics and Gynecology | Admitting: Obstetrics and Gynecology

## 2020-03-14 ENCOUNTER — Ambulatory Visit (INDEPENDENT_AMBULATORY_CARE_PROVIDER_SITE_OTHER): Payer: Medicaid Other | Admitting: Obstetrics and Gynecology

## 2020-03-14 ENCOUNTER — Encounter: Payer: Self-pay | Admitting: Obstetrics and Gynecology

## 2020-03-14 VITALS — BP 102/60 | HR 102 | Ht 63.0 in | Wt 109.0 lb

## 2020-03-14 DIAGNOSIS — Z124 Encounter for screening for malignant neoplasm of cervix: Secondary | ICD-10-CM | POA: Insufficient documentation

## 2020-03-14 DIAGNOSIS — K458 Other specified abdominal hernia without obstruction or gangrene: Secondary | ICD-10-CM

## 2020-03-14 NOTE — Progress Notes (Signed)
Patient ID: Taylor [name], female   DOB: [birth year], 35 y.o.   MRN: [id number]  Reason for Consult: Ovarian Cyst (Referral [facility] Left Ovarian Cyst Ruptured 01/2020)   Referred by Steele Sizer, MD  Subjective:     HPI:  Taylor [name] is a 35 y.o. female. She is following up today regarding recent CT scan and concerns for possible ovarian cysts.   Gynecological History Menarche: 14 Menopause: not applicable LMP: 03/03/2020 Describes periods as monthly, sometimes heavy bleeding.  Last pap smear: uncertain, reports hisotry of abnormal pap smears Last Mammogram: under 42 History of STDs: yes Sexually Active: yes, active with men, recent STD testing in November WNL. Uses tubal ligation for contraception.   Obstetrical History OB History  Gravida Para Term Preterm AB Living  3 3 3     3   SAB IAB Ectopic Multiple Live Births          3    # Outcome Date GA Lbr Len/2nd Weight Sex Delivery Anes PTL Lv  3 Term 05/29/12 [redacted]w[redacted]d  6 lb 10 oz (3.005 kg) F Vag-Spont   LIV  2 Term 07/19/10 [redacted]w[redacted]d  6 lb 12 oz (3.062 kg) M Vag-Spont   LIV  1 Term 09/11/09 [redacted]w[redacted]d  7 lb 1 oz (3.204 kg) M Vag-Spont   LIV    Past Medical History:  Diagnosis Date  . Anemia   . Depression   . Dysmenorrhea   . History of abnormal cervical Pap smear 2009  . Sleep apnea    Family History  Problem Relation Age of Onset  . Diabetes Mother   . Diabetes Maternal Grandmother   . Heart disease Maternal Grandmother   . Sickle cell anemia Sister   . Cancer Maternal Aunt        breast   Past Surgical History:  Procedure Laterality Date  . LAPAROSCOPIC TUBAL LIGATION  05/2012   [facility] [place]  . TUBAL LIGATION      Short Social History:  Social History   Tobacco Use  . Smoking status: Current Every Day Smoker    Packs/day: 0.50  . Smokeless tobacco: Never Used  Substance Use Topics  . Alcohol use: No    No Known Allergies  No current outpatient medications on file.   No  current facility-administered medications for this visit.    Review of Systems  Constitutional: Negative for chills, fatigue, fever and unexpected weight change.  HENT: Negative for trouble swallowing.  Eyes: Negative for loss of vision.  Respiratory: Negative for cough, shortness of breath and wheezing.  Cardiovascular: Negative for chest pain, leg swelling, palpitations and syncope.  GI: Negative for abdominal pain, blood in stool, diarrhea, nausea and vomiting.  GU: Negative for difficulty urinating, dysuria, frequency and hematuria.  Musculoskeletal: Negative for back pain, leg pain and joint pain.  Skin: Negative for rash.  Neurological: Negative for dizziness, headaches, light-headedness, numbness and seizures.  Psychiatric: Negative for behavioral problem, confusion, depressed mood and sleep disturbance.        Objective:  Objective   Vitals:   03/14/20 1457  BP: 102/60  Pulse: (!) 102  Weight: 109 lb (49.4 kg)  Height: 5\' 3"  (1.6 m)   Body mass index is 19.31 kg/m.  Physical Exam Vitals and nursing note reviewed.  Constitutional:      Appearance: She is well-developed and well-nourished.  HENT:     Head: Normocephalic and atraumatic.  Eyes:     Extraocular Movements: EOM normal.  Pupils: Pupils are equal, round, and reactive to light.  Cardiovascular:     Rate and Rhythm: Normal rate and regular rhythm.  Pulmonary:     Effort: Pulmonary effort is normal. No respiratory distress.  Genitourinary:    Comments: External: Normal appearing vulva. No lesions noted.  Speculum examination: Normal appearing cervix. No blood in the vaginal vault. No discharge.   Bimanual examination: Uterus midline, non-tender, normal in size, shape and contour.  No CMT. No adnexal masses. No adnexal tenderness. Pelvis not fixed.  Skin:    General: Skin is warm and dry.  Neurological:     Mental Status: She is alert and oriented to person, place, and time.  Psychiatric:         Mood and Affect: Mood and affect normal.        Behavior: Behavior normal.        Thought Content: Thought content normal.        Judgment: Judgment normal.     Assessment/Plan:     35 yo referred for possible ovarian cysts related to ER scan from 01/24/2020. Discussed results of that CT scan which did not show adnexal masses. Discussed if desired we could perform a pelvic US, but patient has not been having pain or persistent symptoms.  Discussed concerns for internal hernia on CT scan, referral to generally surgery to discuss further.  Monitor for symptoms of SBO and volvulus.  Pap smear performed today  Follow up as needed  More than 30 minutes were spent face to face with the patient in the room, reviewing the medical record, labs and images, and coordinating care for the patient. The plan of management was discussed in detail and counseling was provided.      Adelene Idler MD [facility], [facility] 03/14/2020 3:32 PM

## 2020-03-22 ENCOUNTER — Encounter: Payer: Self-pay | Admitting: General Surgery

## 2020-03-22 ENCOUNTER — Other Ambulatory Visit: Payer: Self-pay

## 2020-03-22 ENCOUNTER — Ambulatory Visit (INDEPENDENT_AMBULATORY_CARE_PROVIDER_SITE_OTHER): Payer: Medicaid Other | Admitting: General Surgery

## 2020-03-22 VITALS — BP 147/92 | HR 91 | Temp 98.5°F | Ht 63.0 in | Wt 105.2 lb

## 2020-03-22 DIAGNOSIS — R9389 Abnormal findings on diagnostic imaging of other specified body structures: Secondary | ICD-10-CM

## 2020-03-22 NOTE — Patient Instructions (Addendum)
Dr.Cannon discussed with patient if she noticed any worsening symptoms to give our office a call. Discussed with patient that there is no need for surgical treatment at the moment.  Follow-up with our office as needed. Please call and ask to speak with a nurse if you develop questions or concerns.  Bowel Obstruction A bowel obstruction means that something is blocking the small or large bowel. The bowel is also called the intestine. It is the long tube that connects the stomach to the opening of the butt (anus). When something blocks the bowel, food and fluids cannot pass through like normal. This condition needs to be treated. Treatment depends on the cause of the problem and how bad the problem is. What are the causes? Common causes of this condition include:  Scar tissue (adhesions) from past surgery or from high-energy X-rays (radiation).  Recent surgery in the belly. This affects how food moves in the bowel.  Some diseases, such as: ? Irritation of the lining of the digestive tract (Crohn's disease). ? Irritation of small pouches in the bowel (diverticulitis).  Growths or tumors.  A bulging organ (hernia).  Twisting of the bowel (volvulus).  A foreign body.  Slipping of a part of the bowel into another part (intussusception).   What are the signs or symptoms? Symptoms of this condition include:  Pain in the belly.  Feeling sick to your stomach (nauseous).  Throwing up (vomiting).  Bloating in the belly.  Being unable to pass gas.  Trouble pooping (constipation).  Watery poop (diarrhea).  A lot of belching. How is this diagnosed? This condition may be diagnosed based on:  A physical exam.  Medical history.  Imaging tests, such as X-ray or CT scan.  Blood tests.  Urine tests. How is this treated? Treatment for this condition may include:  Fluids and pain medicines that are given through an IV tube. Your doctor may tell you not to eat or drink if you feel  sick to your stomach and are throwing up.  Eating a clear liquid diet for a few days.  Putting a small tube (nasogastric tube) into the stomach. This will help with pain, discomfort, and nausea by removing blocked air and fluids from the stomach.  Surgery. This may be needed if other treatments do not work. Follow these instructions at home: Medicines  Take over-the-counter and prescription medicines only as told by your doctor.  If you were prescribed an antibiotic medicine, take it as told by your doctor. Do not stop taking the antibiotic even if you start to feel better. General instructions  Follow your diet as told by your doctor. You may need to: ? Only drink clear liquids until you start to get better. ? Avoid solid foods.  Return to your normal activities as told by your doctor. Ask your doctor what activities are safe for you.  Do not sit for a long time without moving. Get up to take short walks every 1-2 hours. This is important. Ask for help if you feel weak or unsteady.  Keep all follow-up visits as told by your doctor. This is important. How is this prevented? After having a bowel obstruction, you may be more likely to have another. You can do some things to stop it from happening again.  If you have a long-term (chronic) disease, contact your doctor if you see changes or problems.  Take steps to prevent or treat trouble pooping. Your doctor may ask that you: ? Drink enough fluid to  keep your pee (urine) pale yellow. ? Take over-the-counter or prescription medicines. ? Eat foods that are high in fiber. These include beans, whole grains, and fresh fruits and vegetables. ? Limit foods that are high in fat and sugar. These include fried or sweet foods.  Stay active. Ask your doctor which exercises are safe for you.  Avoid stress.  Eat three small meals and three small snacks each day.  Work with a Psychologist, prison and probation services (dietitian) to make a meal plan that works for  you.  Do not use any products that contain nicotine or tobacco, such as cigarettes and e-cigarettes. If you need help quitting, ask your doctor.   Contact a doctor if:  You have a fever.  You have chills. Get help right away if:  You have pain or cramps that get worse.  You throw up blood.  You are sick to your stomach.  You cannot stop throwing up.  You cannot drink fluids.  You feel mixed up (confused).  You feel very thirsty (dehydrated).  Your belly gets more bloated.  You feel weak or you pass out (faint). Summary  A bowel obstruction means that something is blocking the small or large bowel.  Treatment may include IV fluids and pain medicine. You may also have a clear liquid diet, a small tube in your stomach, or surgery.  Drink clear liquids and avoid solid foods until you get better. This information is not intended to replace advice given to you by your health care provider. Make sure you discuss any questions you have with your health care provider. Document Revised: 07/10/2017 Document Reviewed: 07/10/2017 Elsevier Patient Education  2021 [employer].

## 2020-03-22 NOTE — Progress Notes (Signed)
Patient ID: Taylor [name], female   DOB: 1985/05/14, 35 y.o.   MRN: [id number]  Chief Complaint  Patient presents with  . Other    new pt ref Dr.Christanna Schuman internal hernia    HPI Taylor [name] is a 35 y.o. female.  She was referred by Dr. Adelene Idler for evaluation of a possible internal hernia described on a CT scan.  Taylor [name] presented to the emergency department in mid November with left lower quadrant pain.  A CT scan of the abdomen and pelvis was performed.  This revealed a ruptured ovarian cyst that was felt to be the cause of her pain.  An incidental finding of a possible internal hernia was described on the CT report.  As a result of this, she has been referred to general surgery for further evaluation and management.  Taylor [name] surgical history is notable only for a laparoscopic tubal ligation performed via an infra umbilical incision.  She denies any history of bowel obstruction.  No nausea or vomiting.  No bloating.  No difficulty with passing flatus or stool.  She denies any abdominal pain.   Past Medical History:  Diagnosis Date  . Anemia   . Depression   . Dysmenorrhea   . History of abnormal cervical Pap smear 2009  . Sleep apnea     Past Surgical History:  Procedure Laterality Date  . LAPAROSCOPIC TUBAL LIGATION  05/2012   [facility] [place]  . TUBAL LIGATION      Family History  Problem Relation Age of Onset  . Diabetes Mother   . Diabetes Maternal Grandmother   . Heart disease Maternal Grandmother   . Sickle cell anemia Sister   . Cancer Maternal Aunt        breast    Social History Social History   Tobacco Use  . Smoking status: Current Every Day Smoker    Packs/day: 0.50  . Smokeless tobacco: Never Used  Vaping Use  . Vaping Use: Never used  Substance Use Topics  . Alcohol use: No  . Drug use: No    No Known Allergies  No current outpatient medications on file.   No current facility-administered medications for this visit.     Review of Systems Review of Systems  All other systems reviewed and are negative. Or as discussed in the history of present illness.  Blood pressure (!) 147/92, pulse 91, temperature 98.5 F (36.9 C), temperature source Oral, height 5\' 3"  (1.6 m), weight 105 lb 3.2 oz (47.7 kg), last menstrual period 03/03/2020, SpO2 99 %. Body mass index is 18.64 kg/m.  Physical Exam Physical Exam Vitals reviewed.  Constitutional:      General: She is not in acute distress.    Appearance: Normal appearance. She is normal weight.  HENT:     Head: Normocephalic and atraumatic.     Nose:     Comments: Covered with a mask    Mouth/Throat:     Comments: Covered with a mask Eyes:     General: No scleral icterus.       Right eye: No discharge.        Left eye: No discharge.     Conjunctiva/sclera: Conjunctivae normal.     Comments: No proptosis or exophthalmos.  Neck:     Comments: The thyroid is mildly diffusely enlarged bilaterally.  No dominant masses are palpated.  The gland moves freely with deglutition.  The trachea is midline and there is no palpable cervical or supraclavicular lymphadenopathy. Cardiovascular:  Rate and Rhythm: Normal rate and regular rhythm.     Pulses: Normal pulses.     Heart sounds: No murmur heard.   Pulmonary:     Effort: Pulmonary effort is normal.     Breath sounds: Normal breath sounds.  Abdominal:     General: Abdomen is flat. Bowel sounds are normal.     Palpations: Abdomen is soft.     Comments: There is a small scar just below her umbilicus consistent with her prior surgical history.  Genitourinary:    Comments: Deferred Musculoskeletal:        General: No swelling or tenderness.  Skin:    General: Skin is warm and dry.  Neurological:     General: No focal deficit present.     Mental Status: She is alert and oriented to person, place, and time.  Psychiatric:        Mood and Affect: Mood normal.        Behavior: Behavior normal.     Data  Reviewed Results for JESSEE, NEWNAM (MRN [id number]) as of 03/22/2020 12:11  Ref. Range 01/24/2020 09:28 01/24/2020 10:03 01/24/2020 10:07 01/24/2020 12:02 01/26/2020 14:00  Sodium Latest Ref Range: 134 - 144 mmol/L 136    139  Potassium Latest Ref Range: 3.5 - 5.2 mmol/L 3.9    4.4  Chloride Latest Ref Range: 96 - 106 mmol/L 103    107 (H)  CO2 Latest Ref Range: 20 - 29 mmol/L 22    21  Glucose Latest Ref Range: 65 - 99 mg/dL 701 (H)    85  BUN Latest Ref Range: 6 - 20 mg/dL 12    7  Creatinine Latest Ref Range: 0.57 - 1.00 mg/dL 7.79    3.90  Calcium Latest Ref Range: 8.7 - 10.2 mg/dL 9.1    9.4  Anion gap Latest Ref Range: 5 - 15  11      BUN/Creatinine Ratio Latest Ref Range: 9 - 23      11  Alkaline Phosphatase Latest Ref Range: 44 - 121 IU/L 67    67  Albumin Latest Ref Range: 3.8 - 4.8 g/dL 4.5    4.6  Albumin/Globulin Ratio Latest Ref Range: 1.2 - 2.2      2.3 (H)  Lipase Latest Ref Range: 11 - 51 U/L 25      AST Latest Ref Range: 0 - 40 IU/L 31    14  ALT Latest Ref Range: 0 - 32 IU/L 18    10  Total Protein Latest Ref Range: 6.0 - 8.5 g/dL 8.0    6.6  Total Bilirubin Latest Ref Range: 0.0 - 1.2 mg/dL 0.6    0.2  GFR, Estimated Latest Ref Range: >60 mL/min >60      GFR, Est Non African American Latest Ref Range: >59 mL/min/1.73     120  GFR, Est African American Latest Ref Range: >59 mL/min/1.73     138  Lactic Acid, Venous Latest Ref Range: 0.5 - 1.9 mmol/L 2.5 (HH)   0.9   Globulin, Total Latest Ref Range: 1.5 - 4.5 g/dL     2.0  WBC Latest Ref Range: 3.4 - 10.8 x10E3/uL 8.4    7.1  RBC Latest Ref Range: 3.77 - 5.28 x10E6/uL 4.15    3.98  Hemoglobin Latest Ref Range: 11.1 - 15.9 g/dL 30.0    92.3  HCT Latest Ref Range: 34.0 - 46.6 % 42.2    40.0  MCV Latest Ref Range: 79 - 97  fL 101.7 (H)    101 (H)  MCH Latest Ref Range: 26.6 - 33.0 pg 33.5    32.9  MCHC Latest Ref Range: 31.5 - 35.7 g/dL 16.132.9    09.632.8  RDW Latest Ref Range: 11.7 - 15.4 % 13.2    11.9  Platelets Latest  Ref Range: 150 - 450 x10E3/uL 281    257  nRBC Latest Ref Range: 0.0 - 0.2 % 0.0      Neutrophils Latest Ref Range: Not Estab. % 72    65  Lymphocytes Latest Units: % 20      Monocytes Relative Latest Units: % 6      Eosinophil Latest Units: % 1      Basophil Latest Units: % 1      Immature Granulocytes Latest Ref Range: Not Estab. % 0    0  NEUT# Latest Ref Range: 1.4 - 7.0 x10E3/uL 6.1    4.6  Lymphocyte # Latest Ref Range: 0.7 - 3.1 x10E3/uL 1.6    2.0  Monocyte # Latest Ref Range: 0.1 - 1.0 K/uL 0.5      Monocytes Absolute Latest Ref Range: 0.1 - 0.9 x10E3/uL     0.4  Eosinophils Absolute Latest Ref Range: 0.0 - 0.5 K/uL 0.0      Basophils Absolute Latest Ref Range: 0.0 - 0.2 x10E3/uL 0.0    0.0  Abs Immature Granulocytes Latest Ref Range: 0.00 - 0.07 K/uL 0.03      Immature Grans (Abs) Latest Ref Range: 0.0 - 0.1 x10E3/uL     0.0  Lymphs Latest Ref Range: Not Estab. %     29  Monocytes Latest Ref Range: Not Estab. %     6  Basos Latest Ref Range: Not Estab. %     0  Eos Latest Ref Range: Not Estab. %     0  EOS (ABSOLUTE) Latest Ref Range: 0.0 - 0.4 x10E3/uL     0.0  Hemoglobin A1C Latest Ref Range: 4.8 - 5.6 %     5.1  Est. average glucose Bld gHb Est-mCnc Latest Units: mg/dL     045100   These labs were performed at the time of her emergency department visit on November 14, and at her follow-up visit with her primary care provider on the 16th.  Although the initial lactic acid was slightly elevated, this resolved while she was in the emergency room.  Results for Taylor [name], Taylor [name] (MRN [id number]) as of 03/22/2020 12:11  Ref. Range 01/26/2020 14:00 02/24/2020 13:26  TSH Latest Ref Range: 0.450 - 4.500 uIU/mL 0.362 (L) 0.226 (L)  T4,Free(Direct) Latest Ref Range: 0.82 - 1.77 ng/dL  [phone]  These labs show a suppressed TSH within normal free T4.  There is the possibility of mild hyperthyroidism, but additional testing would be necessary to further evaluate.  I personally reviewed the  thyroid ultrasound performed on February 18, 2020, and concur with the radiology interpretation copied here: CLINICAL DATA:  Thyromegaly, hyperthyroidism  EXAM: THYROID ULTRASOUND  TECHNIQUE: Ultrasound examination of the thyroid gland and adjacent soft tissues was performed.  COMPARISON:  None.  FINDINGS: Parenchymal Echotexture: Mildly heterogenous  Isthmus: 1.2 cm  Right lobe: 6.3 x 2.2 x 2.4 cm  Left lobe: 6.4 x 2.6 x 2.4 cm  _________________________________________________________  Estimated total number of nodules >/= 1 cm: 0  Number of spongiform nodules >/=  2 cm not described below (TR1): 0  Number of mixed cystic and solid nodules >/= 1.5 cm not described below (TR2):  0  _________________________________________________________  Mild thyroid enlargement and slight heterogeneity. No hypervascularity or discrete nodule. No focal mass or abnormality. No regional adenopathy.  IMPRESSION: Mild nonspecific thyroid heterogeneity and enlargement.  No other significant finding by ultrasound.  The above is in keeping with the ACR TI-RADS recommendations - J Am Coll Radiol 2017;14:587-595.  I personally reviewed the CT scan and question, with the radiologist report copied here: CLINICAL DATA:  Left abdominal pain  EXAM: CT ABDOMEN AND PELVIS WITH CONTRAST  TECHNIQUE: Multidetector CT imaging of the abdomen and pelvis was performed using the standard protocol following bolus administration of intravenous contrast.  CONTRAST:  100mL OMNIPAQUE IOHEXOL 300 MG/ML  SOLN  COMPARISON:  None.  FINDINGS: Lower chest: Lung bases clear.  Hepatobiliary: No focal liver lesions are appreciable. Gallbladder upper normal in size without wall thickening. No appreciable biliary duct dilatation.  Pancreas: There is no pancreatic mass or inflammatory focus.  Spleen: No splenic lesions are evident.  Adrenals/Urinary Tract: Adrenals bilaterally  appear normal. Kidneys bilaterally show no evident mass or hydronephrosis on either side. There is no renal or ureteral calculus on either side. Urinary bladder is midline with wall thickness within normal limits.  Stomach/Bowel: Moderate stool is noted in the colon. There is no appreciable bowel wall or mesenteric thickening. No evident bowel obstruction. Terminal ileum appears normal. There is no evident free air or portal venous air.  Note that in the upper pelvis in the midline, there is a somewhat swirl like appearance in the mesentery, best seen on coronal slices 27 through 33 series 6, suspicious for an internal hernia which does not contain bowel currently.  Vascular/Lymphatic: No abdominal aortic aneurysm. No arterial vascular lesions are evident. Major venous structures appear patent. There is no evident adenopathy in the abdomen or pelvis.  Reproductive: The uterus is retroverted. No adnexal mass is seen. A small amount of fluid is noted in dependent portion of the pelvis, however.  Other: No appendiceal region inflammation is evident. Diminutive appendix present. No abscess evident in the abdomen pelvis. No ascites beyond mild fluid in the cul-de-sac region.  Musculoskeletal: No blastic or lytic bone lesions. No intramuscular or abdominal wall lesions are evident.  IMPRESSION: 1. Somewhat swirl like appearance in the mesentery of the midline upper pelvis which is not containing bowel. This area appears concerning for a focal internal hernia without bowel obstruction currently. This is a finding that potentially may place bowel at increased risk for obstruction or volvulus. Surgical referral in this regard may well be advisable. 2. Mild fluid in the cul-de-sac which may represent recent ovarian cyst rupture. No adnexal mass seen currently. Uterus is retroverted. 3. Appendix region appears unremarkable. No abscess in the abdomen or pelvis. Currently no bowel  obstruction or bowel wall thickening evident. 4. No evident renal or ureteral calculus. No hydronephrosis. Urinary bladder wall thickness normal.   Electronically Signed   By: Bretta BangWilliam  Woodruff III M.D.   On: 01/24/2020 11:18  Assessment This is a 35 year old woman who presented to the emergency department with left lower quadrant pain that was attributed to a ruptured left ovarian cyst.  Her CT scan raised the question of a possible internal hernia.  She is completely asymptomatic from this.  Her only surgical intervention has been a laparoscopic tubal ligation, which in theory, could create adhesions that could create an internal hernia.  At this time, however, this appears more consistent with radiographic artifact.  Plan Unless she develops a bowel obstruction, there is no  reason to surgically intervene at this time.  She was provided with educational materials describing the signs and symptoms that she should be aware of and for which she should present for medical attention.  If further evaluation of her thyroid function and mild thyromegaly is desired, I am happy to see her on a separate basis for evaluation and management, but I have not scheduled a follow-up visit at this time.  I will see her on an as-needed basis.    Duanne Guess 03/22/2020, 10:31 AM

## 2020-03-23 LAB — CYTOLOGY - PAP
Comment: NEGATIVE
Diagnosis: NEGATIVE
HPV 16: POSITIVE — AB
HPV 18 / 45: NEGATIVE
High risk HPV: POSITIVE — AB

## 2020-03-30 ENCOUNTER — Telehealth: Payer: Self-pay

## 2020-03-30 NOTE — Telephone Encounter (Signed)
Called and left voicemail for patient to call back to be scheduled. 

## 2020-03-30 NOTE — Telephone Encounter (Signed)
Patient is scheduled for 04/11/20 at 1:30

## 2020-03-30 NOTE — Telephone Encounter (Signed)
-----   Message from Natale Milch, MD sent at 03/30/2020  1:55 PM EST ----- Please call this patient to schedule colposcopy.  Thank you,  Dr. Jerene Pitch

## 2020-04-11 ENCOUNTER — Ambulatory Visit: Payer: Medicaid Other | Admitting: Obstetrics and Gynecology

## 2020-04-18 ENCOUNTER — Ambulatory Visit: Payer: Medicaid Other | Admitting: Obstetrics and Gynecology

## 2020-04-25 DIAGNOSIS — E0590 Thyrotoxicosis, unspecified without thyrotoxic crisis or storm: Secondary | ICD-10-CM | POA: Diagnosis not present

## 2020-05-04 ENCOUNTER — Other Ambulatory Visit (HOSPITAL_COMMUNITY)
Admission: RE | Admit: 2020-05-04 | Discharge: 2020-05-04 | Disposition: A | Discharge status: Home / Self Care | Payer: Medicaid Other | Source: Ambulatory Visit | Attending: Obstetrics and Gynecology | Admitting: Obstetrics and Gynecology

## 2020-05-04 ENCOUNTER — Ambulatory Visit (INDEPENDENT_AMBULATORY_CARE_PROVIDER_SITE_OTHER): Payer: Medicaid Other | Admitting: Obstetrics and Gynecology

## 2020-05-04 ENCOUNTER — Other Ambulatory Visit: Payer: Self-pay

## 2020-05-04 ENCOUNTER — Encounter: Payer: Self-pay | Admitting: Obstetrics and Gynecology

## 2020-05-04 VITALS — BP 100/72 | Ht 62.0 in | Wt 112.0 lb

## 2020-05-04 DIAGNOSIS — B977 Papillomavirus as the cause of diseases classified elsewhere: Secondary | ICD-10-CM | POA: Diagnosis not present

## 2020-05-04 DIAGNOSIS — N871 Moderate cervical dysplasia: Secondary | ICD-10-CM | POA: Diagnosis not present

## 2020-05-04 DIAGNOSIS — D069 Carcinoma in situ of cervix, unspecified: Secondary | ICD-10-CM | POA: Diagnosis not present

## 2020-05-04 DIAGNOSIS — R87810 Cervical high risk human papillomavirus (HPV) DNA test positive: Secondary | ICD-10-CM | POA: Diagnosis present

## 2020-05-04 NOTE — Patient Instructions (Signed)

## 2020-05-04 NOTE — Progress Notes (Signed)
   GYNECOLOGY CLINIC COLPOSCOPY PROCEDURE NOTE  35 y.o. 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 Discussed underlying role for HPV infection in the development of cervical dysplasia, its natural history and progression/regression, need for surveillance.  Is the patient  pregnant: No LMP: Patient's last menstrual period was 04/22/2020. Smoking status:  reports that she has been smoking. She has been smoking about 0.50 packs per day. She has never used smokeless tobacco. Future fertility desired:  No, history of tubal ligation  Patient given informed consent, signed copy in the chart, time out was performed.  The patient was position in dorsal lithotomy position. Speculum was placed the cervix was visualized.   After application of acetic acid colposcopic inspection of the cervix was undertaken.   Colposcopy adequate, full visualization of transformation zone: Yes acetowhite lesion(s) noted at 2 and 6 o'clock; corresponding biopsies obtained.   ECC specimen obtained:  Yes  All specimens were labeled and sent to pathology.   Patient was given post procedure instructions.  Will follow up pathology and manage accordingly.  Routine preventative health maintenance measures emphasized.  Physical Exam Genitourinary:        Adelene Idler MD [facility], [facility] 05/04/2020 2:20 PM

## 2020-05-06 LAB — SURGICAL PATHOLOGY

## 2020-05-13 ENCOUNTER — Telehealth: Payer: Self-pay

## 2020-05-13 NOTE — Telephone Encounter (Signed)
Called pt to schedule in office LEEP w Schuman  DOS 3/28 @ 8:30  Adv pt to arr at 8:20

## 2020-05-16 NOTE — Telephone Encounter (Signed)
Noted. CMA aware. 

## 2020-06-06 ENCOUNTER — Ambulatory Visit: Payer: Medicaid Other | Admitting: Obstetrics and Gynecology

## 2020-11-30 ENCOUNTER — Encounter: Payer: Self-pay | Admitting: General Surgery

## 2021-06-01 ENCOUNTER — Other Ambulatory Visit (HOSPITAL_COMMUNITY)
Admission: RE | Admit: 2021-06-01 | Discharge: 2021-06-01 | Disposition: A | Discharge status: Home / Self Care | Payer: Medicaid Other | Source: Ambulatory Visit | Attending: Obstetrics and Gynecology | Admitting: Obstetrics and Gynecology

## 2021-06-01 ENCOUNTER — Ambulatory Visit (INDEPENDENT_AMBULATORY_CARE_PROVIDER_SITE_OTHER): Payer: Medicaid Other | Admitting: Obstetrics and Gynecology

## 2021-06-01 ENCOUNTER — Other Ambulatory Visit: Payer: Self-pay

## 2021-06-01 ENCOUNTER — Encounter: Payer: Self-pay | Admitting: Obstetrics and Gynecology

## 2021-06-01 VITALS — BP 104/70 | Ht 63.0 in | Wt 110.0 lb

## 2021-06-01 DIAGNOSIS — Z124 Encounter for screening for malignant neoplasm of cervix: Secondary | ICD-10-CM

## 2021-06-01 DIAGNOSIS — N771 Vaginitis, vulvitis and vulvovaginitis in diseases classified elsewhere: Secondary | ICD-10-CM

## 2021-06-01 DIAGNOSIS — N760 Acute vaginitis: Secondary | ICD-10-CM

## 2021-06-01 DIAGNOSIS — B9689 Other specified bacterial agents as the cause of diseases classified elsewhere: Secondary | ICD-10-CM | POA: Diagnosis not present

## 2021-06-01 DIAGNOSIS — R875 Abnormal microbiological findings in specimens from female genital organs: Secondary | ICD-10-CM | POA: Diagnosis not present

## 2021-06-01 MED ORDER — METRONIDAZOLE 500 MG PO TABS: 500.0000 mg | ORAL_TABLET | Freq: Two times a day (BID) | ORAL | 0 refills | Status: DC

## 2021-06-01 NOTE — Progress Notes (Signed)
? ?Patient ID: [name], female   DOB: 1985-07-25, 36 y.o.   MRN: [id number] ? ?Reason for Consult: Vaginal Discharge (No itchiness, irritation or odor x couple of weeks) ?  ?Referred by Loura Pardon, MD ? ?Subjective:  ?   ?HPI: ? ?Nikkol Pai is a 36 y.o. female she is here today with complaints of vaginal irritation and odor.  She has noticed this for couple weeks. ? 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  She was scheduled to have a LEEP performed but did not come to that appointment as planned. ? ?Gynecological History ? ?Patient's last menstrual period was 05/25/2021 (exact date). ? ?Past Medical History:  ?Diagnosis Date  ? Anemia   ? Depression   ? Dysmenorrhea   ? History of abnormal cervical Pap smear 2009  ? Sleep apnea   ? ?Family History  ?Problem Relation Age of Onset  ? Diabetes Mother   ? Diabetes Maternal Grandmother   ? Heart disease Maternal Grandmother   ? Sickle cell anemia Sister   ? Cancer Maternal Aunt   ?     breast  ? ?Past Surgical History:  ?Procedure Laterality Date  ? LAPAROSCOPIC TUBAL LIGATION  05/2012  ? [facility] [place]  ? TUBAL LIGATION    ? ? ?Short Social History:  ?Social History  ? ?Tobacco Use  ? Smoking status: Every Day  ?  Packs/day: 0.50  ?  Types: Cigarettes  ? Smokeless tobacco: Never  ?Substance Use Topics  ? Alcohol use: No  ? ? ?No Known Allergies ? ?No current outpatient medications on file.  ? ?No current facility-administered medications for this visit.  ? ? ?Review of Systems  ?Constitutional: Negative for chills, fatigue, fever and unexpected weight change.  ?HENT: Negative for trouble swallowing.  ?Eyes: Negative for loss of vision.  ?Respiratory: Negative for cough, shortness of breath and wheezing.  ?Cardiovascular: Negative for chest pain, leg swelling, palpitations and syncope.  ?GI: Negative for abdominal pain, blood in stool, diarrhea, nausea and vomiting.  ?GU: Negative for difficulty urinating, dysuria, frequency  and hematuria.  ?Musculoskeletal: Negative for back pain, leg pain and joint pain.  ?Skin: Negative for rash.  ?Neurological: Negative for dizziness, headaches, light-headedness, numbness and seizures.  ?Psychiatric: Negative for behavioral problem, confusion, depressed mood and sleep disturbance.   ? ?   ?Objective:  ?Objective  ? ?Vitals:  ? 06/01/21 1342  ?BP: 104/70  ?Weight: 110 lb (49.9 kg)  ?Height: 5\' 3"  (1.6 m)  ? ?Body mass index is 19.49 kg/m?. ? ?Physical Exam ?Vitals and nursing note reviewed. Exam conducted with a chaperone present.  ?Constitutional:   ?   Appearance: Normal appearance. She is well-developed.  ?HENT:  ?   Head: Normocephalic and atraumatic.  ?Eyes:  ?   Extraocular Movements: Extraocular movements intact.  ?   Pupils: Pupils are equal, round, and reactive to light.  ?Cardiovascular:  ?   Rate and Rhythm: Normal rate and regular rhythm.  ?Pulmonary:  ?   Effort: Pulmonary effort is normal. No respiratory distress.  ?   Breath sounds: Normal breath sounds.  ?Abdominal:  ?   General: Abdomen is flat.  ?   Palpations: Abdomen is soft.  ?Genitourinary: ?   Comments: External: Normal appearing vulva. No lesions noted.  ?Speculum examination: Normal appearing cervix. No blood in the vaginal vault. Thin watery discharge.   ?Musculoskeletal:     ?   General: No signs of injury.  ?  Skin: ?   General: Skin is warm and dry.  ?Neurological:  ?   Mental Status: She is alert and oriented to person, place, and time.  ?Psychiatric:     ?   Behavior: Behavior normal.     ?   Thought Content: Thought content normal.     ?   Judgment: Judgment normal.  ? ?Wet Prep: ?Clue Cells: Positive ?Fungal elements: Negative ?Trichomonas: Negative ? ? ?Assessment/Plan:  ?  ? ?36 year old with vaginitis ?We will treat presumptively for bacterial vaginosis.  Flagyl prescription sent. ?NuSwab and Pap smear performed today.  Discussed the importance the patient following up for likely colposcopy given her history of  previous CIN-2 to 3.  Discussed the importance of treating CIN-2-3 with LEEPs or cervical condoms to prevent progression to cervical cancer. ? ?More than 20 minutes were spent face to face with the patient in the room, reviewing the medical record, labs and images, and coordinating care for the patient. The plan of management was discussed in detail and counseling was provided.  ? ?  ? ?Adelene Idler MD ?[facility], [facility] ?06/01/2021 ?1:59 PM ? ? ?

## 2021-06-01 NOTE — Patient Instructions (Signed)
Cervical Dysplasia Cervical dysplasia is a condition in which the cells in a woman's cervix have abnormal changes. The cervix is the opening of the uterus. It is located between the vagina and the uterus. Cervical dysplasia may be an early sign of cervical cancer. If left untreated, this condition may become more severe and may progress to cervical cancer. Early detection, treatment, and follow-up care are very important. What are the causes? Cervical dysplasia is usually caused by a human papillomavirus (HPV) infection. HPV is spread from person to person through sexual contact. This includes oral, vaginal, or anal sex. HPV is the most common sexually transmitted infection (STI). You are more likely to be exposed to HPV through sexual contact if: You have had more than one sexual partner or you have a sexual partner who has multiple sexual partners. You do not use a condom during sex, especially with new sexual partners. What increases the risk? The following factors may make you more likely to develop this condition: Having a family history of cervical cancer or a personal history of cancer of the vagina or vulva. Having had an STI, such as herpes, chlamydia, or gonorrhea. Becoming sexually active before age 18. Having a weakened disease-fighting system (immunesystem). Smoking. Being the daughter of a woman who took diethylstilbestrol (DES), a synthetic estrogen, during pregnancy. What are the signs or symptoms? There are usually no symptoms of this condition. If you do have symptoms, they may include: Abnormal vaginal discharge. Bleeding between periods or after sex. Bleeding during menopause. Pain during sex. How is this diagnosed? This condition may be diagnosed with a Pap test. During this test, cells are swabbed from the cervix and checked under a microscope. If the Pap test is abnormal or if the cervix looks abnormal, you may also have a test in which a tissue sample is removed from  the cervix and looked at under a microscope(biopsy). How is this treated? Treatment varies based on the severity of the condition. Treatment may include: Cryotherapy. During this therapy, the abnormal cells are frozen with a steel-tipped instrument. Loop electrosurgical excision procedure (LEEP). LEEP removes abnormal tissue from the cervix. Surgery to remove abnormal tissue. This is usually done in more severe cases. Options include: A cone biopsy. This treatment removes the cervical canal and part of the center of the cervix. Hysterectomy. This is a surgery in which the uterus and cervix are removed. Follow these instructions at home: Take over-the-counter and prescription medicines only as told by your health care provider. Do not use tampons, have sex, or douche until your health care provider says it is safe. Keep all follow-up visits. This is important. Women who have been treated for cervical dysplasia should have regular pelvic exams and Pap tests. How is this prevented? Practice safe sex to help prevent STIs. Have regular Pap tests. Talk with your health care provider about how often you need these tests. Pap tests will help identify cell changes that can lead to cancer. Ask your health care provider about possible vaccines to protect yourself against HPV. Contact a health care provider if: You develop genital warts. The risk of cervical cancer is higher with certain types of HPV. Your menstrual period is heavier than normal or you develop bright red bleeding, which may include blood clots. You have abnormal vaginal discharge. You have a fever. Get help right away if: You have pain or cramps in the abdomen that get worse, and medicine does not help to relieve your pain. You feel light-headed   and are unusually weak, or you faint. Summary Cervical dysplasia is a condition in which a woman's cervix cells have abnormal changes. If left untreated, this condition may become more severe  and may progress to cervical cancer. Early detection, treatment, and follow-up care are very important in managing this condition. Have regular pelvic exams and Pap tests. Talk with your health care provider about how often you need these tests. Pap tests will help identify cell changes that can lead to cancer. This information is not intended to replace advice given to you by your health care provider. Make sure you discuss any questions you have with your health care provider. Document Revised: 09/04/2019 Document Reviewed: 09/04/2019 Elsevier Patient Education  2022 [employer]  

## 2021-06-04 ENCOUNTER — Other Ambulatory Visit: Payer: Self-pay | Admitting: Obstetrics and Gynecology

## 2021-06-04 DIAGNOSIS — A749 Chlamydial infection, unspecified: Secondary | ICD-10-CM

## 2021-06-04 LAB — NUSWAB VAGINITIS PLUS (VG+)
Atopobium vaginae: HIGH Score — AB
BVAB 2: HIGH Score — AB
Candida albicans, NAA: NEGATIVE
Candida glabrata, NAA: NEGATIVE
Chlamydia trachomatis, NAA: POSITIVE — AB
Megasphaera 1: HIGH Score — AB
Neisseria gonorrhoeae, NAA: NEGATIVE
Trich vag by NAA: NEGATIVE

## 2021-06-04 MED ORDER — AZITHROMYCIN 500 MG PO TABS: 1000.0000 mg | ORAL_TABLET | Freq: Once | ORAL | 0 refills | Status: AC

## 2021-06-07 LAB — CYTOLOGY - PAP
Comment: NEGATIVE
Diagnosis: NEGATIVE
Diagnosis: REACTIVE
High risk HPV: NEGATIVE

## 2021-06-14 IMAGING — CT CT ABD-PELV W/ CM
2 of 4 series · 15 of 46 positions shown, 17 images · IV contrast (APPLIED)
Comparison: None.

CLINICAL DATA: Left abdominal pain

EXAM:
CT ABDOMEN AND PELVIS WITH CONTRAST
TECHNIQUE: Multidetector CT imaging of the abdomen and pelvis was performed
using the standard protocol following bolus administration of
intravenous contrast.
CONTRAST:  100mL OMNIPAQUE IOHEXOL 300 MG/ML  SOLN

[Series 2: routine abd/pel with · axial · 0.64mm/px · z∈[-434,-84]mm · 12 of 78 slices shown, 14 images]
[im 4/78  soft-tissue]
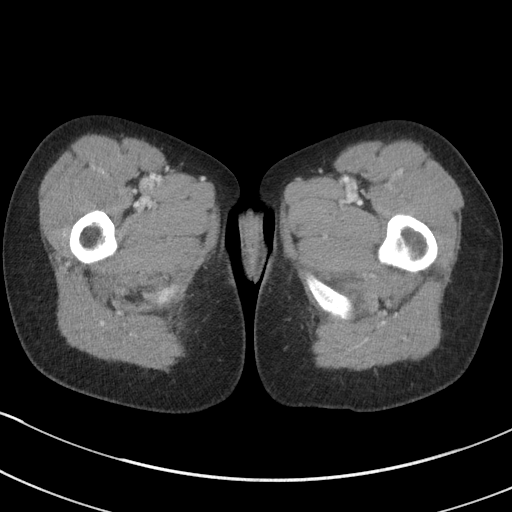
[im 4/78  bone]
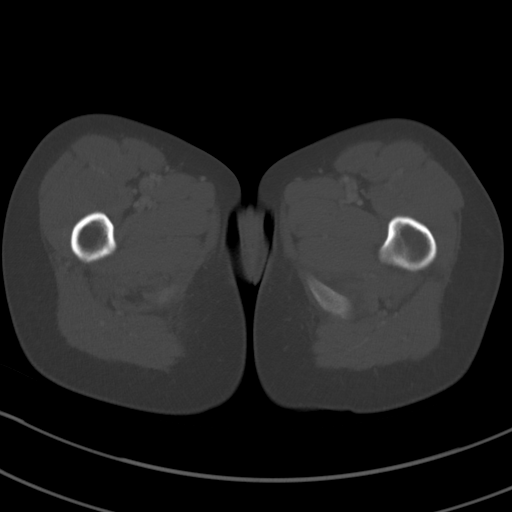
[im 11/78  soft-tissue]
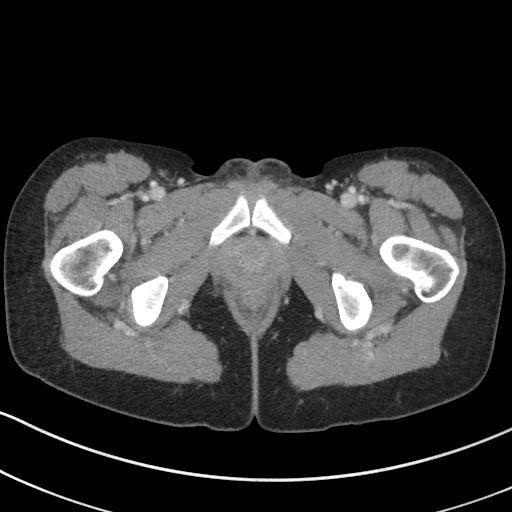
[im 17/78  soft-tissue]
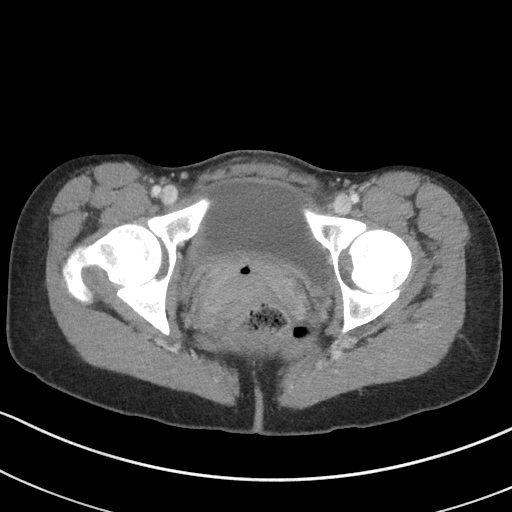
[im 24/78  soft-tissue]
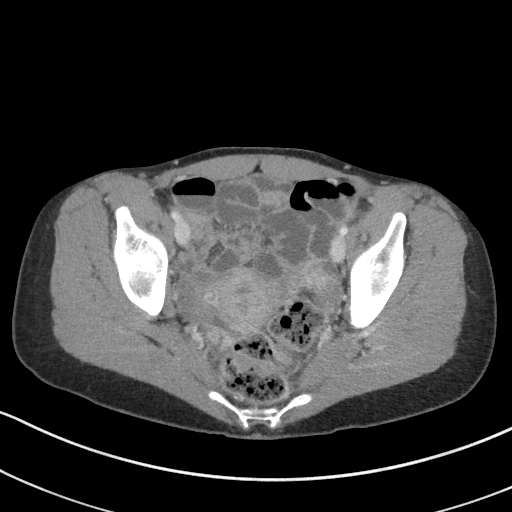
[im 31/78  soft-tissue]
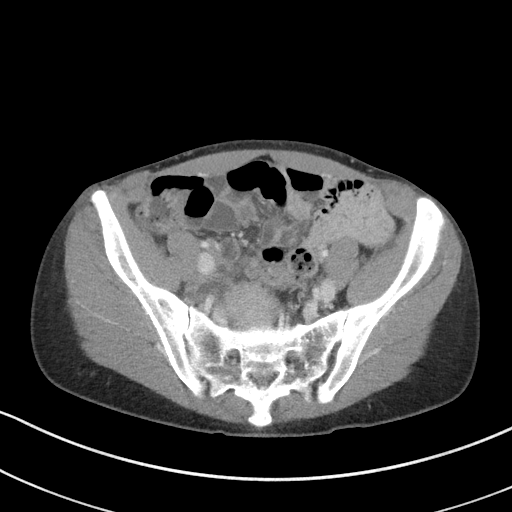
[im 37/78  soft-tissue]
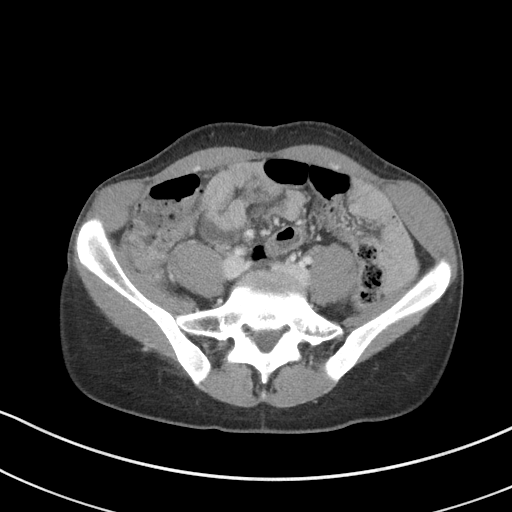
[im 41/78  soft-tissue]
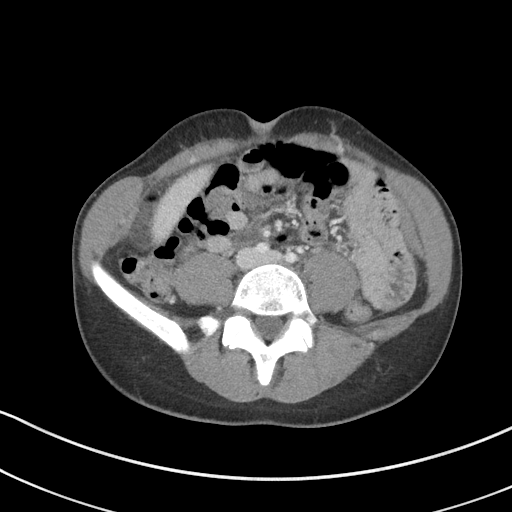
[im 47/78  soft-tissue]
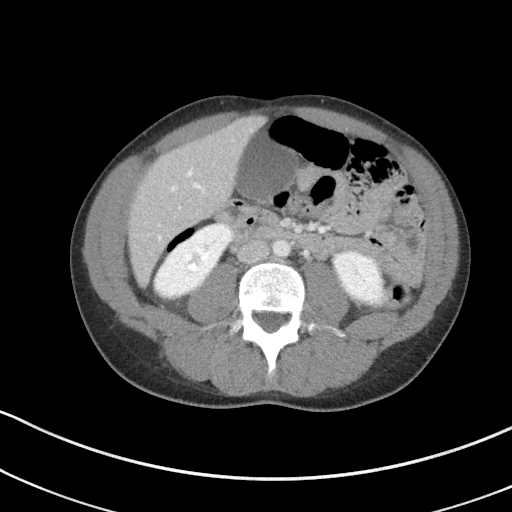
[im 54/78  soft-tissue]
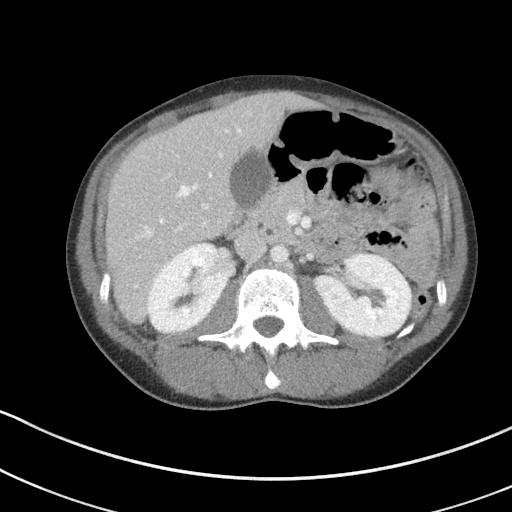
[im 54/78  bone]
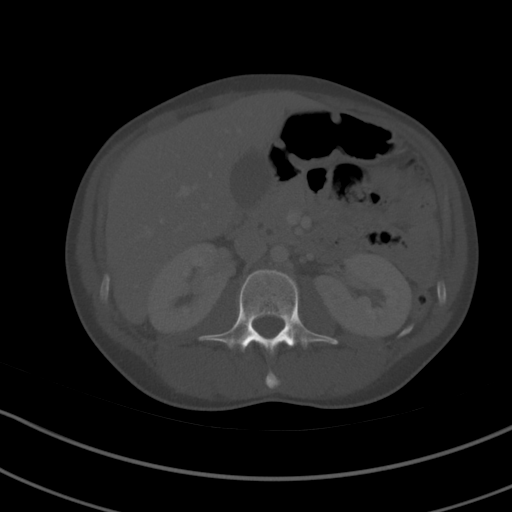
[im 61/78  soft-tissue]
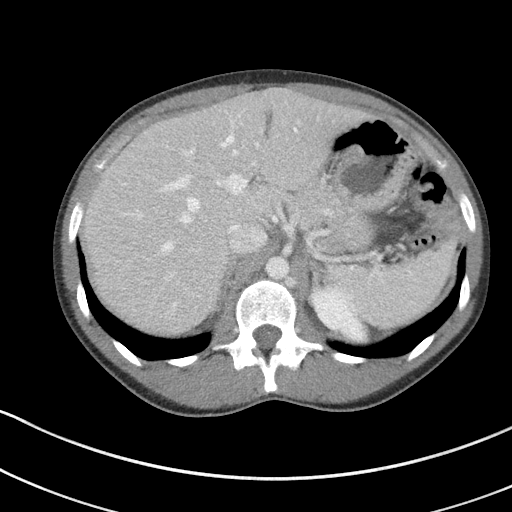
[im 67/78  soft-tissue]
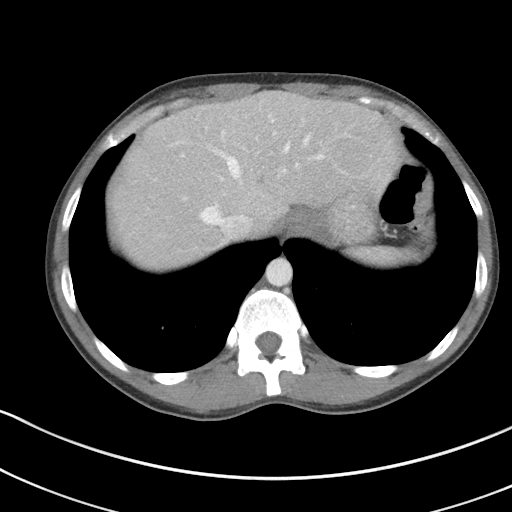
[im 74/78  soft-tissue]
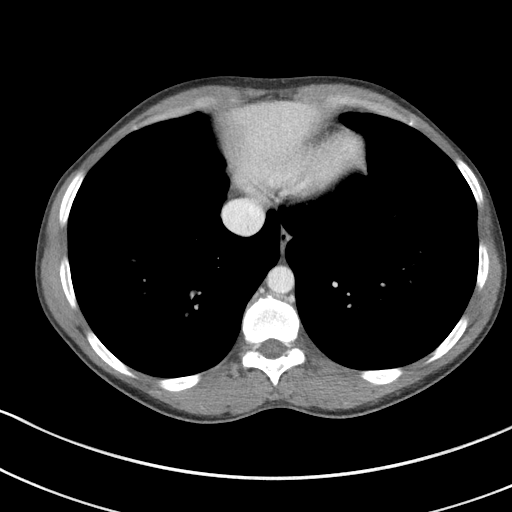

[Series 6: coronal st · coronal · 0.60mm/px · 3 of 81 slices shown]
[im 27/81  soft-tissue]
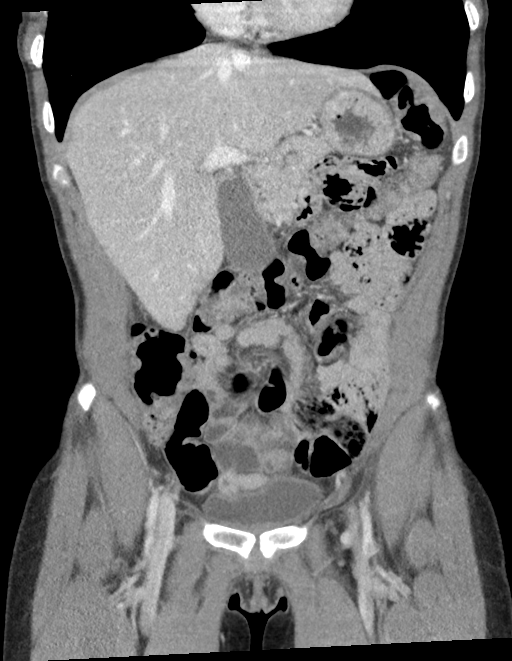
[im 36/81  soft-tissue]
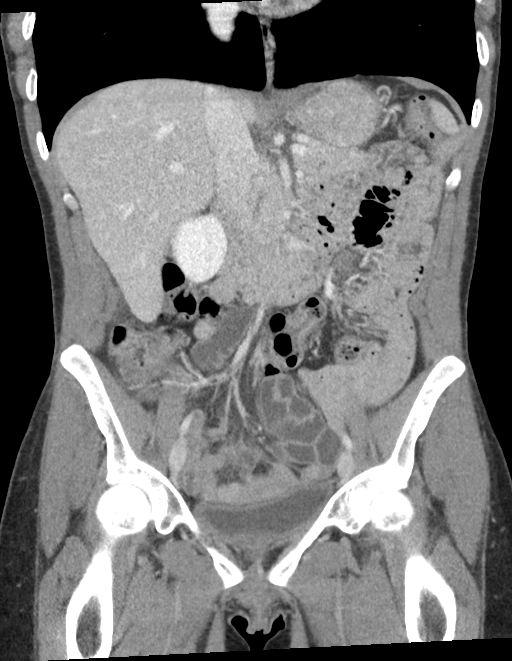
[im 45/81  soft-tissue]
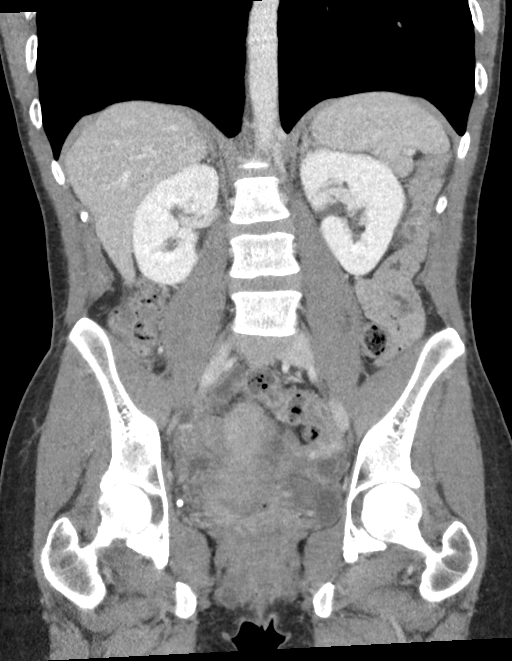

[15 of 46 positions shown; findings below may reference images not displayed]

FINDINGS: Lower chest: Lung bases clear.

Hepatobiliary: No focal liver lesions are appreciable. Gallbladder
upper normal in size without wall thickening. No appreciable biliary
duct dilatation.

Pancreas: There is no pancreatic mass or inflammatory focus.

Spleen: No splenic lesions are evident.

Adrenals/Urinary Tract: Adrenals bilaterally appear normal. Kidneys
bilaterally show no evident mass or hydronephrosis on either side.
There is no renal or ureteral calculus on either side. Urinary
bladder is midline with wall thickness within normal limits.

Stomach/Bowel: Moderate stool is noted in the colon. There is no
appreciable bowel wall or mesenteric thickening. No evident bowel
obstruction. Terminal ileum appears normal. There is no evident free
air or portal venous air.

Note that in the upper pelvis in the midline, there is a somewhat
swirl like appearance in the mesentery, best seen on coronal slices
27 through 33 series 6, suspicious for an internal hernia which does
not contain bowel currently.

Vascular/Lymphatic: No abdominal aortic aneurysm. No arterial
vascular lesions are evident. Major venous structures appear patent.
There is no evident adenopathy in the abdomen or pelvis.

Reproductive: The uterus is retroverted. No adnexal mass is seen. A
small amount of fluid is noted in dependent portion of the pelvis,
however.

Other: No appendiceal region inflammation is evident. Diminutive
appendix present. No abscess evident in the abdomen pelvis. No
ascites beyond mild fluid in the cul-de-sac region.

Musculoskeletal: No blastic or lytic bone lesions. No intramuscular
or abdominal wall lesions are evident.
IMPRESSION: 1. Somewhat swirl like appearance in the mesentery of the midline
upper pelvis which is not containing bowel. This area appears
concerning for a focal internal hernia without bowel obstruction
currently. This is a finding that potentially may place bowel at
increased risk for obstruction or volvulus. Surgical referral in
this regard may well be advisable.
2. Mild fluid in the cul-de-sac which may represent recent ovarian
cyst rupture. No adnexal mass seen currently. Uterus is retroverted.
3. Appendix region appears unremarkable. No abscess in the abdomen
or pelvis. Currently no bowel obstruction or bowel wall thickening
evident.
4. No evident renal or ureteral calculus. No hydronephrosis. Urinary
bladder wall thickness normal.

## 2021-12-31 ENCOUNTER — Encounter: Payer: Self-pay | Admitting: Emergency Medicine

## 2021-12-31 ENCOUNTER — Emergency Department: Payer: Medicaid Other

## 2021-12-31 ENCOUNTER — Emergency Department
Admission: EM | Admit: 2021-12-31 | Discharge: 2022-01-01 | Disposition: A | Discharge status: Home / Self Care | Payer: Medicaid Other | Attending: Emergency Medicine | Admitting: Emergency Medicine

## 2021-12-31 DIAGNOSIS — S32302B Unspecified fracture of left ilium, initial encounter for open fracture: Secondary | ICD-10-CM

## 2021-12-31 DIAGNOSIS — Z043 Encounter for examination and observation following other accident: Secondary | ICD-10-CM | POA: Diagnosis present

## 2021-12-31 DIAGNOSIS — R109 Unspecified abdominal pain: Secondary | ICD-10-CM | POA: Diagnosis not present

## 2021-12-31 DIAGNOSIS — W19XXXA Unspecified fall, initial encounter: Secondary | ICD-10-CM | POA: Diagnosis not present

## 2021-12-31 DIAGNOSIS — S2242XA Multiple fractures of ribs, left side, initial encounter for closed fracture: Secondary | ICD-10-CM | POA: Diagnosis not present

## 2021-12-31 DIAGNOSIS — S32315B Nondisplaced avulsion fracture of left ilium, initial encounter for open fracture: Secondary | ICD-10-CM | POA: Diagnosis not present

## 2021-12-31 DIAGNOSIS — S32512A Fracture of superior rim of left pubis, initial encounter for closed fracture: Secondary | ICD-10-CM | POA: Diagnosis not present

## 2021-12-31 DIAGNOSIS — S3991XA Unspecified injury of abdomen, initial encounter: Secondary | ICD-10-CM | POA: Diagnosis not present

## 2021-12-31 DIAGNOSIS — Y990 Civilian activity done for income or pay: Secondary | ICD-10-CM | POA: Diagnosis not present

## 2021-12-31 DIAGNOSIS — S3992XA Unspecified injury of lower back, initial encounter: Secondary | ICD-10-CM | POA: Diagnosis not present

## 2021-12-31 LAB — COMPREHENSIVE METABOLIC PANEL
ALT: 12 U/L (ref 0–44)
AST: 21 U/L (ref 15–41)
Albumin: 4.4 g/dL (ref 3.5–5.0)
Alkaline Phosphatase: 70 U/L (ref 38–126)
Anion gap: 10 (ref 5–15)
BUN: 11 mg/dL (ref 6–20)
CO2: 19 mmol/L — ABNORMAL LOW (ref 22–32)
Calcium: 9.1 mg/dL (ref 8.9–10.3)
Chloride: 112 mmol/L — ABNORMAL HIGH (ref 98–111)
Creatinine, Ser: 0.55 mg/dL (ref 0.44–1.00)
GFR, Estimated: >60 mL/min (ref >60)
Glucose, Bld: 82 mg/dL (ref 70–99)
Potassium: 4 mmol/L (ref 3.5–5.1)
Sodium: 141 mmol/L (ref 135–145)
Total Bilirubin: 0.8 mg/dL (ref 0.3–1.2)
Total Protein: 8 g/dL (ref 6.5–8.1)

## 2021-12-31 LAB — CBC WITH DIFFERENTIAL/PLATELET
Abs Immature Granulocytes: 0.04 10*3/uL (ref 0.00–0.07)
Basophils Absolute: 0.1 10*3/uL (ref 0.0–0.1)
Basophils Relative: 1 %
Eosinophils Absolute: 0.1 10*3/uL (ref 0.0–0.5)
Eosinophils Relative: 0 %
HCT: 39.9 % (ref 36.0–46.0)
Hemoglobin: 13.2 g/dL (ref 12.0–15.0)
Immature Granulocytes: 0 %
Lymphocytes Relative: 17 %
Lymphs Abs: 2.3 10*3/uL (ref 0.7–4.0)
MCH: 33.8 pg (ref 26.0–34.0)
MCHC: 33.1 g/dL (ref 30.0–36.0)
MCV: 102 fL — ABNORMAL HIGH (ref 80.0–100.0)
Monocytes Absolute: 0.5 10*3/uL (ref 0.1–1.0)
Monocytes Relative: 4 %
Neutro Abs: 10.1 10*3/uL — ABNORMAL HIGH (ref 1.7–7.7)
Neutrophils Relative %: 78 %
Platelets: 299 10*3/uL (ref 150–400)
RBC: 3.91 MIL/uL (ref 3.87–5.11)
RDW: 13.2 % (ref 11.5–15.5)
WBC: 13.1 10*3/uL — ABNORMAL HIGH (ref 4.0–10.5)
nRBC: 0 % (ref 0.0–0.2)

## 2021-12-31 LAB — LIPASE, BLOOD: Lipase: 29 U/L (ref 11–51)

## 2021-12-31 LAB — POC URINE PREG, ED: Preg Test, Ur: NEGATIVE

## 2021-12-31 MED ORDER — IOHEXOL 300 MG/ML  SOLN
100.0000 mL | Freq: Once | INTRAMUSCULAR | Status: AC | PRN
  Administered 2021-12-31: 100 mL via INTRAVENOUS

## 2021-12-31 MED ORDER — ONDANSETRON 4 MG PO TBDP
4.0000 mg | ORAL_TABLET | Freq: Once | ORAL | Status: AC
  Administered 2021-12-31: 4 mg via ORAL
  Filled 2021-12-31: qty 1

## 2021-12-31 MED ORDER — OXYCODONE-ACETAMINOPHEN 5-325 MG PO TABS
1.0000 | ORAL_TABLET | Freq: Once | ORAL | Status: AC
  Administered 2021-12-31: 1 via ORAL
  Filled 2021-12-31: qty 1
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
## 2021-12-31 NOTE — ED Notes (Signed)
ED Provider at bedside. 
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
## 2021-12-31 NOTE — ED Notes (Signed)
Patient transported to CT 
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
## 2022-01-01 DIAGNOSIS — S32302A Unspecified fracture of left ilium, initial encounter for closed fracture: Secondary | ICD-10-CM | POA: Diagnosis not present

## 2022-01-01 MED ORDER — ONDANSETRON 4 MG PO TBDP: 4.0000 mg | ORAL_TABLET | Freq: Three times a day (TID) | ORAL | 0 refills | Status: AC | PRN

## 2022-01-01 MED ORDER — OXYCODONE-ACETAMINOPHEN 5-325 MG PO TABS: 1.0000 | ORAL_TABLET | Freq: Four times a day (QID) | ORAL | 0 refills | Status: AC | PRN

## 2022-01-01 NOTE — ED Notes (Signed)
Walker given to pt, pt verbalized understanding of how to use the walker, declined return demonstration at this time.

## 2022-01-01 NOTE — ED Notes (Signed)
Signing pad did not work, pt verbalized understanding.  

## 2022-01-03 DIAGNOSIS — S32592A Other specified fracture of left pubis, initial encounter for closed fracture: Secondary | ICD-10-CM | POA: Diagnosis not present

## 2022-01-26 DIAGNOSIS — S32592D Other specified fracture of left pubis, subsequent encounter for fracture with routine healing: Secondary | ICD-10-CM | POA: Diagnosis not present

## 2022-01-26 HISTORY — DX: Other specified fracture of left pubis, subsequent encounter for fracture with routine healing: S32.592D

## 2022-02-07 DIAGNOSIS — S32592D Other specified fracture of left pubis, subsequent encounter for fracture with routine healing: Secondary | ICD-10-CM | POA: Diagnosis not present

## 2022-02-19 DIAGNOSIS — S32592D Other specified fracture of left pubis, subsequent encounter for fracture with routine healing: Secondary | ICD-10-CM | POA: Diagnosis not present

## 2022-02-26 DIAGNOSIS — S32592D Other specified fracture of left pubis, subsequent encounter for fracture with routine healing: Secondary | ICD-10-CM | POA: Diagnosis not present

## 2022-03-08 DIAGNOSIS — S32592D Other specified fracture of left pubis, subsequent encounter for fracture with routine healing: Secondary | ICD-10-CM | POA: Diagnosis not present

## 2022-04-09 DIAGNOSIS — S32592D Other specified fracture of left pubis, subsequent encounter for fracture with routine healing: Secondary | ICD-10-CM | POA: Diagnosis not present

## 2022-12-27 ENCOUNTER — Encounter: Payer: Self-pay | Admitting: Emergency Medicine

## 2022-12-27 ENCOUNTER — Other Ambulatory Visit: Payer: Self-pay

## 2022-12-27 ENCOUNTER — Emergency Department
Admission: EM | Admit: 2022-12-27 | Discharge: 2022-12-27 | Disposition: A | Discharge status: Home / Self Care | Payer: Medicaid Other | Attending: Emergency Medicine | Admitting: Emergency Medicine

## 2022-12-27 DIAGNOSIS — M79641 Pain in right hand: Secondary | ICD-10-CM | POA: Diagnosis not present

## 2022-12-27 NOTE — Discharge Instructions (Signed)
Apply ice for 20 minutes on, 20 minutes off several times per day. You may also take tylenol/motrin per package instructions to help with your symptoms. Return for new, worsening, or change in symptoms or other concerns.

## 2022-12-27 NOTE — ED Provider Notes (Signed)
[facility] Provider Note    Event Date/Time   First MD Initiated Contact with Patient 12/27/22 0801     (approximate)   History   Hand Pain   HPI  Taylor [name] is a 37 y.o. 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  Patient denies any acute injury.  She denies paresthesias or weakness.  She reports that she mostly has pain with flexion and extension of her wrist, though she is able to perform these motions.  She has not noticed any swelling or skin changes.  No previous injuries or surgeries to that extremity.  Patient Active Problem List   Diagnosis Date Noted   Hyperthyroidism 01/27/2020   Rupture of ovarian cyst 01/26/2020   Hernia, abdominal 01/26/2020   Anemia 01/16/2017           Physical Exam   Triage Vital Signs: ED Triage Vitals [12/27/22 0758]  Encounter Vitals Group     BP (!) 153/91     Systolic BP Percentile      Diastolic BP Percentile      Pulse Rate (!) 101     Resp 12     Temp 97.7 F (36.5 C)     Temp Source Oral     SpO2 100 %     Weight      Height      Head Circumference      Peak Flow      Pain Score 7     Pain Loc      Pain Education      Exclude from Growth Chart     Most recent vital signs: Vitals:   12/27/22 0758  BP: (!) 153/91  Pulse: (!) 101  Resp: 12  Temp: 97.7 F (36.5 C)  SpO2: 100%    Physical Exam Vitals and nursing note reviewed.  Constitutional:      General: Awake and alert. No acute distress.    Appearance: Normal appearance. The patient is normal weight.  HENT:     Head: Normocephalic and atraumatic.     Mouth: Mucous membranes are moist.  Eyes:     General: PERRL. Normal EOMs        Right eye: No discharge.        Left eye: No discharge.     Conjunctiva/sclera: Conjunctivae normal.  Cardiovascular:     Rate and Rhythm: Normal rate and regular rhythm.     Pulses: Normal pulses.     Heart sounds:  Normal heart sounds Pulmonary:     Effort: Pulmonary effort is normal. No respiratory distress.     Breath sounds: Normal breath sounds.  Abdominal:     Abdomen is soft. There is no abdominal tenderness. No rebound or guarding. No distention. Musculoskeletal:        General: No swelling. Normal range of motion.     Cervical back: Normal range of motion and neck supple.  Normal-appearing right hand.  No erythema, ecchymosis, or swelling noted.  Normal intrinsic muscle function of the hand.  Normal grip strength.  Positive Finkelstein test.  Normal radial pulse.  Normal capillary refill.  No palmar abnormalities. Skin:    General: Skin is warm and dry.     Capillary Refill: Capillary refill takes less than 2 seconds.     Findings: No rash.  Neurological:     Mental Status: The patient is awake and alert.  ED Results / Procedures / Treatments   Labs (all labs ordered are listed, but only abnormal results are displayed) Labs Reviewed - No data to display   EKG     RADIOLOGY     PROCEDURES:  Critical Care performed:   Procedures   MEDICATIONS ORDERED IN ED: Medications - No data to display   IMPRESSION / MDM / ASSESSMENT AND PLAN / ED COURSE  I reviewed the triage vital signs and the nursing notes.   Differential diagnosis includes, but is not limited to, dequervains tenosynovitis, tendonitis, less likely fracture or dislocation.  Patient is awake and alert, hemodynamically stable and afebrile.  She is nontoxic in appearance.  She is neurovascularly intact.  She has normal grip strength.  There are no signs or symptoms of infection at this time, no warmth, erythema, fevers, or other constitutional symptoms.  She has full normal active and passive range of motion of fingers.  She does have pain with Lourena Simmonds test, likely component of tendinitis vs dequervains.  She was given an Ace wrap for extra support.  No trauma or bony tenderness to suggest fracture, do not  feel she would benefit from x-ray today and patient is in agreement with this.  She requested a work note which was provided.  We discussed rest, ice, elevation, and/Tylenol per package instructions.  Recommended outpatient follow-up and return precautions.  Patient understands and agrees with plan.  Discharged in stable condition.   Patient's presentation is most consistent with acute complicated illness / injury requiring diagnostic workup.    FINAL CLINICAL IMPRESSION(S) / ED DIAGNOSES   Final diagnoses:  Hand pain, right     Rx / DC Orders   ED Discharge Orders     None        Note:  This document was prepared using Dragon voice recognition software and may include unintentional dictation errors.   Jackelyn Hoehn, PA-C 12/27/22 1006    Concha Se, MD 01/01/23 [phone]

## 2022-12-27 NOTE — ED Triage Notes (Signed)
Pt c/o R hand pain without trauma or injury. 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

## 2023-05-14 ENCOUNTER — Other Ambulatory Visit: Payer: Self-pay

## 2023-05-14 ENCOUNTER — Emergency Department
Admission: EM | Admit: 2023-05-14 | Discharge: 2023-05-14 | Disposition: A | Discharge status: Home / Self Care | Attending: Student in an Organized Health Care Education/Training Program | Admitting: Student in an Organized Health Care Education/Training Program

## 2023-05-14 DIAGNOSIS — M79605 Pain in left leg: Secondary | ICD-10-CM | POA: Diagnosis not present

## 2023-05-14 MED ORDER — PREDNISONE 20 MG PO TABS: 40.0000 mg | ORAL_TABLET | Freq: Every day | ORAL | 0 refills | Status: AC

## 2023-05-14 NOTE — ED Provider Notes (Signed)
 [facility] Provider Note    Event Date/Time   First MD Initiated Contact with Patient 05/14/23 [phone]     (approximate)   History   Leg Pain   HPI  Taylor [name] is a 38 y.o. female who presents to the ER for evaluation of left thigh pain when she woke from sleep this morning.  No associated numbness or tingling.  No low back pain.  She works lifting heavy objects.  Denies any popping sensation.  No loss of bowel or bladder control.  No fevers or chills.  No recent falls or trauma.  Pain is worsened with movement.  Has not noted any swelling edema of the leg.  She is able to ambulate with steady gait. Physical Exam   Triage Vital Signs: ED Triage Vitals  Encounter Vitals Group     BP 05/14/23 0835 (!) 144/84     Systolic BP Percentile --      Diastolic BP Percentile --      Pulse Rate 05/14/23 0835 88     Resp 05/14/23 0835 16     Temp 05/14/23 0835 98 F (36.7 C)     Temp Source 05/14/23 0835 Oral     SpO2 05/14/23 0835 100 %     Weight 05/14/23 0836 110 lb (49.9 kg)     Height 05/14/23 0836 5\' 2"  (1.575 m)     Head Circumference --      Peak Flow --      Pain Score 05/14/23 0834 8     Pain Loc --      Pain Education --      Exclude from Growth Chart --     Most recent vital signs: Vitals:   05/14/23 0835  BP: (!) 144/84  Pulse: 88  Resp: 16  Temp: 98 F (36.7 C)  SpO2: 100%     Constitutional: Alert  Eyes: Conjunctivae are normal.  Head: Atraumatic. Nose: No congestion/rhinnorhea. Mouth/Throat: Mucous membranes are moist.   Neck: Painless ROM.  Cardiovascular:   Good peripheral circulation. Respiratory: Normal respiratory effort.  No retractions.  Gastrointestinal: Soft and nontender.  Musculoskeletal:  no deformity.  Compartments are soft.  No edema.  Neurovascular intact distally.  Good motor strength throughout.  Able to ambulate with steady gait.  Pain reproducible with left straight leg raise. Neurologic:  MAE  spontaneously. No gross focal neurologic deficits are appreciated.  Skin:  Skin is warm, dry and intact. No rash noted. Psychiatric: Mood and affect are normal. Speech and behavior are normal.    ED Results / Procedures / Treatments   Labs (all labs ordered are listed, but only abnormal results are displayed) Labs Reviewed - No data to display   EKG     RADIOLOGY    PROCEDURES:  Critical Care performed:   Procedures   MEDICATIONS ORDERED IN ED: Medications - No data to display   IMPRESSION / MDM / ASSESSMENT AND PLAN / ED COURSE  I reviewed the triage vital signs and the nursing notes.                              Differential diagnosis includes, but is not limited to, lumbago, sciatica, meralgia paresthetica, DVT, claudication, fracture, dislocation, arthritis, bursitis  Patient presented to the ER for evaluation of symptoms as described above.  Exam seems more consistent with sciatica.  Possible meralgia paresthetica given distribution of pain but no significant risk  factors.  No report of trauma.  She is able to ambulate and weight-bear without significant discomfort therefore have a low suspicion for osseous injury do not feel that imaging clinically indicated.  Not consistent with cauda equina or spinal stenosis.  Possible radiculopathy but she denies any significant back pain.  Not consistent with DVT.  Not consistent with PAD.  Does appear appropriate for trial of conservative management.  We discussed signs and symptoms for which the patient should return to the ER       FINAL CLINICAL IMPRESSION(S) / ED DIAGNOSES   Final diagnoses:  Left leg pain     Rx / DC Orders   ED Discharge Orders          Ordered    predniSONE (DELTASONE) 20 MG tablet  Daily        05/14/23 0852             Note:  This document was prepared using Dragon voice recognition software and may include unintentional dictation errors.    Willy Eddy, MD 05/14/23  [phone]

## 2023-05-14 NOTE — ED Triage Notes (Signed)
 Pt to ED with family member for L leg pain since this AM. Pain starts in buttock and goes down lateral thigh. Pain is sore and throbbing. Denies known injury.

## 2023-06-27 ENCOUNTER — Encounter: Payer: Self-pay | Admitting: Pediatrics

## 2023-06-27 ENCOUNTER — Ambulatory Visit (INDEPENDENT_AMBULATORY_CARE_PROVIDER_SITE_OTHER): Admitting: Pediatrics

## 2023-06-27 VITALS — BP 122/77 | HR 83 | Temp 98.0°F | Ht 61.0 in | Wt 109.2 lb

## 2023-06-27 DIAGNOSIS — F17210 Nicotine dependence, cigarettes, uncomplicated: Secondary | ICD-10-CM

## 2023-06-27 DIAGNOSIS — E0590 Thyrotoxicosis, unspecified without thyrotoxic crisis or storm: Secondary | ICD-10-CM | POA: Diagnosis not present

## 2023-06-27 DIAGNOSIS — Z716 Tobacco abuse counseling: Secondary | ICD-10-CM

## 2023-06-27 DIAGNOSIS — Z13220 Encounter for screening for lipoid disorders: Secondary | ICD-10-CM

## 2023-06-27 DIAGNOSIS — E039 Hypothyroidism, unspecified: Secondary | ICD-10-CM | POA: Diagnosis not present

## 2023-06-27 DIAGNOSIS — D72829 Elevated white blood cell count, unspecified: Secondary | ICD-10-CM | POA: Diagnosis not present

## 2023-06-27 DIAGNOSIS — Z131 Encounter for screening for diabetes mellitus: Secondary | ICD-10-CM | POA: Diagnosis not present

## 2023-06-27 DIAGNOSIS — Z1330 Encounter for screening examination for mental health and behavioral disorders, unspecified: Secondary | ICD-10-CM

## 2023-06-27 DIAGNOSIS — Z7689 Persons encountering health services in other specified circumstances: Secondary | ICD-10-CM

## 2023-06-27 NOTE — Assessment & Plan Note (Signed)
 Discussed cessation and available treatment including nicotine replacement options, pharmacologic treatment, and/or online resources. Based on our discussion, she does not plan to initiate treatment today. Total time spent on discussion: 3 minutes.

## 2023-06-27 NOTE — Progress Notes (Signed)
 7  Establish Care Note  BP 122/77   Pulse 83   Temp 98 F (36.7 C) (Oral)   Ht 5\' 1"  (1.549 m)   Wt 109 lb 3.2 oz (49.5 kg)   LMP 06/19/2023 (Exact Date)   SpO2 98%   BMI 20.63 kg/m    Subjective:    Patient ID: Taylor [name], female    DOB: 20-Jan-1986, 38 y.o.   MRN: [id number]  HPI: Taylor [name] is a 38 y.o. female  Chief Complaint  Patient presents with   Establish Care    Establishing care, the following was discussed today:  Discussed the use of AI scribe software for clinical note transcription with the patient, who gave verbal consent to proceed.  History of Present Illness   Taylor [name] is a 39 year old female who presents to reestablish care.  She has a history of low thyroid levels identified years ago. She does not recall experiencing significant symptoms such as fatigue. During a previous examination, a palpable finding was noted in her neck, but she has not undergone a thyroid ultrasound in the past.  She smokes approximately five cigarettes per day and has not expressed a desire to quit at this time.      #HM Will review HM records and updated as needed.  Relevant past medical, surgical, family and social history reviewed and updated as indicated. Interim medical history since our last visit reviewed. Allergies and medications reviewed and updated.  ROS per HPI unless specifically indicated above     Objective:    BP 122/77   Pulse 83   Temp 98 F (36.7 C) (Oral)   Ht 5\' 1"  (1.549 m)   Wt 109 lb 3.2 oz (49.5 kg)   LMP 06/19/2023 (Exact Date)   SpO2 98%   BMI 20.63 kg/m   Wt Readings from Last 3 Encounters:  06/27/23 109 lb 3.2 oz (49.5 kg)  05/14/23 110 lb (49.9 kg)  12/27/22 106 lb 0.7 oz (48.1 kg)     Physical Exam Constitutional:      Appearance: Normal appearance.  Pulmonary:     Effort: Pulmonary effort is normal.  Musculoskeletal:        General: Normal range of motion.  Skin:    Comments: Normal skin color   Neurological:     General: No focal deficit present.     Mental Status: She is alert. Mental status is at baseline.  Psychiatric:        Mood and Affect: Mood normal.        Behavior: Behavior normal.        Thought Content: Thought content normal.         06/27/2023    1:19 PM 03/19/2017    9:48 AM 01/14/2017   10:06 AM  Depression screen PHQ 2/9  Decreased Interest 0 0 0  Down, Depressed, Hopeless 0 0 0  PHQ - 2 Score 0 0 0  Altered sleeping 0 2   Tired, decreased energy 0 1   Change in appetite 0 0   Feeling bad or failure about yourself  0 0   Trouble concentrating 0 0   Moving slowly or fidgety/restless 0 0   Suicidal thoughts 0 0   PHQ-9 Score 0 3   Difficult doing work/chores Not difficult at all Somewhat difficult         06/27/2023    1:19 PM  GAD 7 : Generalized Anxiety Score  Nervous, Anxious, on Edge 0  Control/stop worrying 0  Worry too much - different things 0  Trouble relaxing 0  Restless 0  Easily annoyed or irritable 0  Afraid - awful might happen 0  Total GAD 7 Score 0  Anxiety Difficulty Not difficult at all       Assessment & Plan:  Assessment & Plan   Hyperthyroidism Assessment & Plan: Previously diagnosed, she says no meds were recommended and resolved. Asymptomatic but will repeat.  Orders: -     Thyroid Panel With TSH -     Comprehensive metabolic panel with GFR  Cigarette nicotine dependence without complication Tobacco abuse counseling Assessment & Plan: Discussed cessation and available treatment including nicotine replacement options, pharmacologic treatment, and/or online resources. Based on our discussion, she does not plan to initiate treatment today. Total time spent on discussion: 3 minutes.   Leukocytosis, unspecified type Noted on recent blood work. Recheck. -     CBC  Diabetes mellitus screening -     Hemoglobin A1c  Lipid screening -     Lipid panel  Encounter to establish care Reviewed available patient  record including history, medications, problem list. HM updated as able. Will review and/or request outside records (if applicable) and will fill remaining HM gaps as needed at follow up visit.  Encounter for behavioral health screening As part of their intake evaluation, the patient was screened for depression, anxiety.  PHQ9 SCORE 0, GAD7 SCORE 0. Screening results negative for tested conditions. CTM.   Follow up plan: Return in about 3 weeks (around 07/18/2023) for Physical.  Hadassah Letters, MD

## 2023-06-27 NOTE — Assessment & Plan Note (Signed)
 Previously diagnosed, she says no meds were recommended and resolved. Asymptomatic but will repeat.

## 2023-06-27 NOTE — Patient Instructions (Signed)

## 2023-06-28 LAB — THYROID PANEL WITH TSH
Free Thyroxine Index: 1.4 (ref 1.2–4.9)
T3 Uptake Ratio: 28 % (ref 24–39)
T4, Total: 4.9 ug/dL (ref 4.5–12.0)
TSH: 0.283 u[IU]/mL — ABNORMAL LOW (ref 0.450–4.500)

## 2023-06-28 LAB — COMPREHENSIVE METABOLIC PANEL WITH GFR
ALT: 10 IU/L (ref 0–32)
AST: 19 IU/L (ref 0–40)
Albumin: 4.5 g/dL (ref 3.9–4.9)
Alkaline Phosphatase: 82 IU/L (ref 44–121)
BUN/Creatinine Ratio: 13 (ref 9–23)
BUN: 7 mg/dL (ref 6–20)
Bilirubin Total: 0.3 mg/dL (ref 0.0–1.2)
CO2: 18 mmol/L — ABNORMAL LOW (ref 20–29)
Calcium: 8.7 mg/dL (ref 8.7–10.2)
Chloride: 102 mmol/L (ref 96–106)
Creatinine, Ser: 0.54 mg/dL — ABNORMAL LOW (ref 0.57–1.00)
Globulin, Total: 2.2 g/dL (ref 1.5–4.5)
Glucose: 77 mg/dL (ref 70–99)
Potassium: 4.8 mmol/L (ref 3.5–5.2)
Sodium: 136 mmol/L (ref 134–144)
Total Protein: 6.7 g/dL (ref 6.0–8.5)
eGFR: 122 mL/min/{1.73_m2} (ref >59)

## 2023-06-28 LAB — HEMOGLOBIN A1C
Est. average glucose Bld gHb Est-mCnc: 103 mg/dL
Hgb A1c MFr Bld: 5.2 % (ref 4.8–5.6)

## 2023-06-28 LAB — LIPID PANEL
Chol/HDL Ratio: 2.9 ratio (ref 0.0–4.4)
Cholesterol, Total: 210 mg/dL — ABNORMAL HIGH (ref 100–199)
HDL: 72 mg/dL (ref >39)
LDL Chol Calc (NIH): 93 mg/dL (ref 0–99)
Triglycerides: 276 mg/dL — ABNORMAL HIGH (ref 0–149)
VLDL Cholesterol Cal: 45 mg/dL — ABNORMAL HIGH (ref 5–40)

## 2023-06-28 LAB — CBC
Hematocrit: 40.1 % (ref 34.0–46.6)
Hemoglobin: 13.5 g/dL (ref 11.1–15.9)
MCH: 34 pg — ABNORMAL HIGH (ref 26.6–33.0)
MCHC: 33.7 g/dL (ref 31.5–35.7)
MCV: 101 fL — ABNORMAL HIGH (ref 79–97)
Platelets: 284 10*3/uL (ref 150–450)
RBC: 3.97 x10E6/uL (ref 3.77–5.28)
RDW: 13.1 % (ref 11.7–15.4)
WBC: 6 10*3/uL (ref 3.4–10.8)

## 2023-07-09 ENCOUNTER — Other Ambulatory Visit: Payer: Self-pay | Admitting: Pediatrics

## 2023-07-09 DIAGNOSIS — E0590 Thyrotoxicosis, unspecified without thyrotoxic crisis or storm: Secondary | ICD-10-CM

## 2023-07-09 NOTE — Progress Notes (Signed)
 H/o hyperthyroidsm, referral to endo placed.  Hadassah Letters, MD

## 2023-07-17 ENCOUNTER — Encounter (HOSPITAL_COMMUNITY): Payer: Self-pay

## 2023-07-24 ENCOUNTER — Encounter: Payer: Self-pay | Admitting: Pediatrics

## 2023-07-24 ENCOUNTER — Other Ambulatory Visit (HOSPITAL_COMMUNITY)
Admission: RE | Admit: 2023-07-24 | Discharge: 2023-07-24 | Disposition: A | Discharge status: Home / Self Care | Source: Ambulatory Visit | Attending: Pediatrics | Admitting: Pediatrics

## 2023-07-24 ENCOUNTER — Ambulatory Visit: Admitting: Pediatrics

## 2023-07-24 VITALS — BP 126/83 | HR 89 | Temp 98.2°F | Ht 61.0 in | Wt 108.0 lb

## 2023-07-24 DIAGNOSIS — E0590 Thyrotoxicosis, unspecified without thyrotoxic crisis or storm: Secondary | ICD-10-CM

## 2023-07-24 DIAGNOSIS — Z124 Encounter for screening for malignant neoplasm of cervix: Secondary | ICD-10-CM

## 2023-07-24 DIAGNOSIS — F17210 Nicotine dependence, cigarettes, uncomplicated: Secondary | ICD-10-CM | POA: Diagnosis not present

## 2023-07-24 DIAGNOSIS — Z1159 Encounter for screening for other viral diseases: Secondary | ICD-10-CM | POA: Diagnosis not present

## 2023-07-24 DIAGNOSIS — Z113 Encounter for screening for infections with a predominantly sexual mode of transmission: Secondary | ICD-10-CM | POA: Diagnosis not present

## 2023-07-24 DIAGNOSIS — Z0000 Encounter for general adult medical examination without abnormal findings: Secondary | ICD-10-CM

## 2023-07-24 DIAGNOSIS — Z1330 Encounter for screening examination for mental health and behavioral disorders, unspecified: Secondary | ICD-10-CM | POA: Diagnosis not present

## 2023-07-24 NOTE — Patient Instructions (Addendum)
 Endocrinology: [phone] to make an appointment.   [facility] Specialty Address [facility] Phone Number Practice Name  [place] [facility] [address] [place] [phone] Lenon Radar CRISP DDS MS PA  [place] [facility] [address] [place] [phone] Alma Argyle DDS PA III  [place] [facility] [address] [place] [phone] [facility]  [place] [facility] [address] [place] [phone] [facility]  [place] [facility] [address] [place] [phone] Adolph Aid IV DMD PA  [place] [facility] [address] [place] [phone] [facility] FAMILY DENTISTRY  [place] [facility] [address] [place] [phone] [facility] General Dentist [address] [place] [address] [place] [phone] Jayne Mews  [place] [facility] [address] [place] [phone] [facility] [place]  [place] [facility] [address] [place] [phone] [facility] Oral Surgeon [address] Rosena Conradi [phone] KERVIN MACK DMD MS [facility] [place]  [place] [facility] [address] [place] [phone] [facility] PEDIATRIC DENTISTRY

## 2023-07-24 NOTE — Assessment & Plan Note (Signed)
 Has not heard about scheduling with endocrinology. Provided appointment desk number.

## 2023-07-24 NOTE — Assessment & Plan Note (Signed)
 Discussed cessation and available treatment including nicotine replacement options, pharmacologic treatment, and/or online resources. Based on our discussion, she does not plan to initiate treatment today. Total time spent on discussion: 3 minutes.

## 2023-07-24 NOTE — Progress Notes (Signed)
 BP 126/83   Pulse 89   Temp 98.2 F (36.8 C) (Oral)   Ht 5\' 1"  (1.549 m)   Wt 108 lb (49 kg)   LMP 07/15/2023 (Exact Date)   SpO2 97%   BMI 20.41 kg/m    Annual Physical Exam - Female  Subjective:   CC: Annual Exam   Taylor [name] is a 38 y.o. female patient here for a preventative health maintenance exam and has no acute complaints.  Health Habits: DIET: in general, a "healthy" diet   EXERCISE: very active, mix of cardio and resistance training   DENTAL EXAM: Due EYE EXAM: Not applicable                       Relevant Gynecologic History LMP: Patient's last menstrual period was 07/15/2023 (exact date).  Menstrual Status: premenopausal, Flow regular every month without intermenstrual spotting PAP History:  Result Date Procedure Results Follow-ups  06/01/2021 Cytology - PAP High risk HPV: Negative Adequacy: Satisfactory for evaluation; transformation zone component PRESENT. 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 CERVIX, 2 O'CLOCK, BIOPSY: - Benign endocervical glandular a...   03/14/2020 Cytology - PAP High risk HPV: Positive (A) HPV 16: Positive (A) HPV 18 / 45: Negative Adequacy: Satisfactory for evaluation; transformation zone component PRESENT. Diagnosis: - Negative for intraepithelial lesion or malignancy (NILM) Comment: Normal Reference Range HPV 16 18 45 -Negative     History abnormal PAP: Yes  Sexual activity: single partner, contraception - none Family history breast, ovarian cancer: Yes, aunt maternal breast cancer  Domestic Violence Screen, feels safe at home: Yes  family history includes  Cancer in her maternal aunt; Diabetes in her maternal grandmother and mother; Heart disease in her maternal grandmother; Sickle cell anemia in her sister.  Social History   Tobacco Use   Smoking status: Every Day    Current packs/day: 0.50    Average packs/day: 0.5 packs/day for 15.2 years (7.6 ttl pk-yrs)    Types: Cigarettes    Start date: 05/13/2008   Smokeless tobacco: Never  Vaping Use   Vaping status: Never Used  Substance Use Topics   Alcohol use: No   Drug use: No   Social History   Social History Narrative   Not on file    Social drivers questionnaire is reviewed and is positive for : none  Depression Screening:     07/24/2023    9:58 AM 06/27/2023    1:19 PM 03/19/2017    9:48 AM 01/14/2017   10:06 AM  Depression screen PHQ 2/9  Decreased Interest 0 0 0 0  Down, Depressed, Hopeless 0 0 0 0  PHQ - 2 Score 0 0 0 0  Altered sleeping 0 0 2   Tired, decreased energy 0 0 1   Change in appetite 0 0 0   Feeling bad or failure about yourself  0 0 0   Trouble concentrating 0 0 0   Moving slowly or fidgety/restless 0 0 0   Suicidal thoughts 0 0 0   PHQ-9 Score 0 0 3   Difficult doing work/chores Not difficult at all Not difficult at all Somewhat difficult  07/24/2023    9:59 AM 06/27/2023    1:19 PM  GAD 7 : Generalized Anxiety Score  Nervous, Anxious, on Edge 0 0  Control/stop worrying 0 0  Worry too much - different things 0 0  Trouble relaxing 0 0  Restless 0 0  Easily annoyed or irritable 0 0  Afraid - awful might happen 0 0  Total GAD 7 Score 0 0  Anxiety Difficulty Not difficult at all Not difficult at all    Mental Health Plan: CTM  Self Management Goals  Goals   None     Health Maintenance Colon Cancer Screening : Not applicable Mammogram : Not applicable DXA scan : Not applicable Immunizations : DECLINES  Review of Systems See HPI for relevant ROS.  No outpatient medications prior to visit.   No facility-administered medications  prior to visit.     Patient Active Problem List   Diagnosis Date Noted   Cigarette nicotine dependence without complication 06/27/2023   Hyperthyroidism 01/27/2020    Objective:   Vitals:   07/24/23 0957  BP: 126/83  Pulse: 89  Temp: 98.2 F (36.8 C)  Height: 5\' 1"  (1.549 m)  Weight: 108 lb (49 kg)  SpO2: 97%  TempSrc: Oral  BMI (Calculated): 20.42    Body mass index is 20.41 kg/m.  Physical Exam Exam conducted with a chaperone present.  Constitutional:      Appearance: Normal appearance.  HENT:     Head: Normocephalic and atraumatic.  Eyes:     Pupils: Pupils are equal, round, and reactive to light.  Cardiovascular:     Rate and Rhythm: Normal rate and regular rhythm.     Pulses: Normal pulses.     Heart sounds: Normal heart sounds.  Pulmonary:     Effort: Pulmonary effort is normal.     Breath sounds: Normal breath sounds.  Abdominal:     General: Abdomen is flat.     Palpations: Abdomen is soft.  Genitourinary:    Exam position: Lithotomy position.     Vagina: Normal.     Cervix: Normal.     Comments: Cervix difficult to find among redundant tissue Musculoskeletal:        General: Normal range of motion.     Cervical back: Normal range of motion.  Skin:    General: Skin is warm and dry.     Capillary Refill: Capillary refill takes less than 2 seconds.  Neurological:     General: No focal deficit present.     Mental Status: She is alert. Mental status is at baseline.  Psychiatric:        Mood and Affect: Mood normal.        Behavior: Behavior normal.     Assessment and Plan:   Annual physical exam Discussed lifestyle modifications and goals including plant based eating styles (such as: Mediterranean eating style), regular exercise (at least 150 min of moderate-intensity aerobic exercise per week, given AHA workout handout), get adequate sleep, and continue working with PCP towards meeting health goals to ensure healthy aging.   Cigarette  nicotine dependence without complication Assessment & Plan: Discussed cessation and available treatment including nicotine replacement options, pharmacologic treatment, and/or online resources. Based on our discussion, she does not plan to initiate treatment today. Total time spent on discussion: 3 minutes.   Hyperthyroidism Assessment & Plan: Has not heard about scheduling with endocrinology. Provided appointment desk number.  Encounter for behavioral health screening As part of their intake evaluation,  the patient was screened for depression, anxiety.  PHQ9 SCORE 0, GAD7 SCORE 0. Screening results negative for tested conditions. CTM.   Screening for cervical cancer -     Cytology - PAP  Screen for STD (sexually transmitted disease) -     Chlamydia/Gonococcus/Trichomonas, NAA -     HIV Antibody (routine testing w rflx) -     RPR w/reflex to TrepSure  Encounter for hepatitis C screening test for low risk patient -     Hepatitis C antibody     This plan was discussed with the patient and questions were answered. There were no further concerns.  Follow up as indicated, or sooner should any new problems arise, if conditions worsen, or if they are otherwise concerned.   See patient instructions for additional information.  Hadassah Letters, MD  Family Medicine      Future Appointments  Date Time Provider Department Center  07/29/2024  9:40 AM Hadassah Letters, MD [facility]

## 2023-07-26 ENCOUNTER — Ambulatory Visit: Payer: Self-pay | Admitting: Pediatrics

## 2023-07-26 LAB — HIV ANTIBODY (ROUTINE TESTING W REFLEX)

## 2023-07-26 LAB — TREPONEMAL ANTIBODIES, TPPA: Treponemal Antibodies, TPPA: NONREACTIVE

## 2023-07-26 LAB — RPR W/REFLEX TO TREPSURE: RPR: NONREACTIVE

## 2023-07-26 LAB — HEPATITIS C ANTIBODY: Hep C Virus Ab: NONREACTIVE

## 2023-07-27 LAB — CHLAMYDIA/GONOCOCCUS/TRICHOMONAS, NAA
Chlamydia by NAA: NEGATIVE
Gonococcus by NAA: NEGATIVE
Trich vag by NAA: NEGATIVE

## 2023-07-29 LAB — CYTOLOGY - PAP
Adequacy: ABSENT
Comment: NEGATIVE
High risk HPV: NEGATIVE

## 2023-11-07 ENCOUNTER — Encounter: Payer: Self-pay | Admitting: "Endocrinology

## 2023-11-07 ENCOUNTER — Ambulatory Visit (INDEPENDENT_AMBULATORY_CARE_PROVIDER_SITE_OTHER): Admitting: "Endocrinology

## 2023-11-07 VITALS — BP 108/80 | HR 90 | Ht 61.0 in | Wt 107.0 lb

## 2023-11-07 DIAGNOSIS — E010 Iodine-deficiency related diffuse (endemic) goiter: Secondary | ICD-10-CM

## 2023-11-07 DIAGNOSIS — E0590 Thyrotoxicosis, unspecified without thyrotoxic crisis or storm: Secondary | ICD-10-CM | POA: Diagnosis not present

## 2023-11-07 NOTE — Progress Notes (Addendum)
 Outpatient Endocrinology Note Taylor Birmingham, MD  11/07/23   Taylor [name] [birth year] [id number]  Referring Provider: Herold Hadassah SQUIBB, MD Primary Care Provider: Herold Hadassah SQUIBB, MD Subjective  No chief complaint on file.   Assessment & Plan  Diagnoses and all orders for this visit:  Subclinical hyperthyroidism -     TSH -     T4, free -     T3, free -     TRAb (TSH Receptor Binding Antibody) -     Thyroid  stimulating immunoglobulin  Thyromegaly -     US  THYROID ; Future -     US  THYROID      Taylor [name] has never taken any thyroid  medication. Patient is currently clinically and biochemically hyperthyroid.  Discussed the etiology for hyperthyroidism. Educated on thyroid  axis.  Recommend the following: labs today. No history of osteoporosis/fractures/arrhthymias.  Repeat labs in 3 months or sooner if symptoms of hyper or hypothyroidism develop.  Counseled on: -complications of untreated hyperthyroidism including atrial fibrillation, heart failure and osteoporosis -side effects of Methimazole including but not limited to allergic reaction, rash, bone marrow suppression, liver dysfunction and teratogenic potential -implications in pregnancy and breastfeeding -compliance and follow up needs    Thyromegaly noted on exam 02/18/20: Thyroid  U/S, mild nonspecific thyroid  heterogeneity and enlargement. Ordered f/u thyroid  U/S  If you notice any symptoms of worsening fatigue, fever with sore throat, loss of appetite, yellowing of eyes, dark urine, joint pains, sores in the mouth, itchy rash, light colored stools or abdominal pain, please stop the medication and call us  immediately as this can be a serious side effect of the medication.   I have reviewed current medications, nurse's notes, allergies, vital signs, past medical and surgical history, family medical history, and social history for this encounter. Counseled patient on symptoms, examination findings, lab  findings, imaging results, treatment decisions and monitoring and prognosis. The patient understood the recommendations and agrees with the treatment plan. All questions regarding treatment plan were fully answered.   Return in about 1 month (around 12/11/2023) for visit, labs today.   Taylor Birmingham, MD  11/07/23   I have reviewed current medications, nurse's notes, allergies, vital signs, past medical and surgical history, family medical history, and social history for this encounter. Counseled patient on symptoms, examination findings, lab findings, imaging results, treatment decisions and monitoring and prognosis. The patient understood the recommendations and agrees with the treatment plan. All questions regarding treatment plan were fully answered.   History of Present Illness Taylor [name] is a 38 y.o. year old female who presents to our clinic with subclinical hyperthyroidism diagnosed in 06/2023.    Never been on thyroid  medication.  Symptoms suggestive of HYPOTHYROIDISM:  fatigue No weight gain No cold intolerance  No constipation  No  Symptoms suggestive of HYPERTHYROIDISM:  weight loss  No heat intolerance No hyperdefecation  No palpitations  No  Compressive symptoms:  dysphagia  No dysphonia  No positional dyspnea (especially with simultaneous arms elevation)  No  Smokes  Yes On biotin  No Personal history of head/neck surgery/irradiation  No  Physical Exam  BP 108/80   Pulse 90   Ht 5' 1 (1.549 m)   Wt 107 lb (48.5 kg)   SpO2 99%   BMI 20.22 kg/m  Constitutional: well developed, well nourished Head: normocephalic, atraumatic, no exophthalmos Eyes: sclera anicteric, no redness Neck: + thyromegaly, no thyroid  tenderness; no nodules palpated Lungs: normal respiratory effort Neurology: alert and oriented, no fine hand tremor Skin:  dry, no appreciable rashes Musculoskeletal: no appreciable defects Psychiatric: normal mood and affect  Allergies No  Known Allergies  Current Medications Patient's Medications   No medications on file    Past Medical History Past Medical History:  Diagnosis Date   Anemia    Depression    Dysmenorrhea    Hernia, abdominal 01/26/2020   CT scan 01/24/20 --  This area appears concerning for a focal internal hernia without bowel obstruction currently. This is a finding that potentially may place bowel at increased risk for obstruction or volvulus. Surgical referral in this regard may well be advisable.     History of abnormal cervical Pap smear 2009   Other specified fracture of left pubis, subsequent encounter for fracture with routine healing 01/26/2022   Sleep apnea     Past Surgical History Past Surgical History:  Procedure Laterality Date   LAPAROSCOPIC TUBAL LIGATION  05/2012   [facility] [place]   TUBAL LIGATION      Family History family history includes Cancer in her maternal aunt; Diabetes in her maternal grandmother and mother; Heart disease in her maternal grandmother; Sickle cell anemia in her sister.  Social History Social History   Socioeconomic History   Marital status: Married    Spouse name: Janne Marlin   Number of children: Not on file   Years of education: Not on file   Highest education level: Some college, no degree  Occupational History    Employer: [employer]  Tobacco Use   Smoking status: Every Day    Current packs/day: 0.50    Average packs/day: 0.5 packs/day for 15.5 years (7.7 ttl pk-yrs)    Types: Cigarettes    Start date: 05/13/2008   Smokeless tobacco: Never  Vaping Use   Vaping status: Never Used  Substance and Sexual Activity   Alcohol use: No   Drug use: No   Sexual activity: Yes    Birth control/protection: Surgical    Comment: BTL3  Other Topics Concern   Not on file  Social History Narrative   Not on file   Social Drivers of Health   Financial Resource Strain: Low Risk  (06/23/2023)   Overall Financial Resource Strain (CARDIA)     Difficulty of Paying Living Expenses: Not hard at all  Food Insecurity: No Food Insecurity (06/23/2023)   Hunger Vital Sign    Worried About Running Out of Food in the Last Year: Never true    Ran Out of Food in the Last Year: Never true  Transportation Needs: No Transportation Needs (06/23/2023)   PRAPARE - Administrator, Civil Service (Medical): No    Lack of Transportation (Non-Medical): No  Physical Activity: Unknown (06/23/2023)   Exercise Vital Sign    Days of Exercise per Week: 0 days    Minutes of Exercise per Session: Not on file  Stress: No Stress Concern Present (06/23/2023)   [employer] of Occupational Health - Occupational Stress Questionnaire    Feeling of Stress : Only a little  Social Connections: Moderately Integrated (06/23/2023)   Social Connection and Isolation Panel    Frequency of Communication with Friends and Family: More than three times a week    Frequency of Social Gatherings with Friends and Family: Twice a week    Attends Religious Services: More than 4 times per year    Active Member of [employer] or Organizations: No    Attends Banker Meetings: Not on file    Marital Status: Living with  partner  Intimate Partner Violence: Unknown (05/10/2018)   Humiliation, Afraid, Rape, and Kick questionnaire    Fear of Current or Ex-Partner: Patient declined    Emotionally Abused: Patient declined    Physically Abused: Patient declined    Sexually Abused: Patient declined    Laboratory Investigations Lab Results  Component Value Date   TSH 0.283 (L) 06/27/2023   TSH 0.226 (L) 02/24/2020   TSH 0.362 (L) 01/26/2020   FREET4 0.91 02/24/2020     No results found for: TSI   No components found for: TRAB   Lab Results  Component Value Date   CHOL 210 (H) 06/27/2023   Lab Results  Component Value Date   HDL 72 06/27/2023   Lab Results  Component Value Date   LDLCALC 93 06/27/2023   Lab Results  Component Value Date   TRIG 276  (H) 06/27/2023   Lab Results  Component Value Date   CHOLHDL 2.9 06/27/2023   Lab Results  Component Value Date   CREATININE 0.54 (L) 06/27/2023   No results found for: GFR    Component Value Date/Time   NA 136 06/27/2023 1337   K 4.8 06/27/2023 1337   CL 102 06/27/2023 1337   CO2 18 (L) 06/27/2023 1337   GLUCOSE 77 06/27/2023 1337   GLUCOSE 82 12/31/2021 2317   BUN 7 06/27/2023 1337   CREATININE 0.54 (L) 06/27/2023 1337   CALCIUM 8.7 06/27/2023 1337   PROT 6.7 06/27/2023 1337   ALBUMIN 4.5 06/27/2023 1337   AST 19 06/27/2023 1337   ALT 10 06/27/2023 1337   ALKPHOS 82 06/27/2023 1337   BILITOT 0.3 06/27/2023 1337   GFRNONAA >60 12/31/2021 2317   GFRAA 138 01/26/2020 1400      Latest Ref Rng & Units 06/27/2023    1:37 PM 12/31/2021   11:17 PM 01/26/2020    2:00 PM  BMP  Glucose 70 - 99 mg/dL 77  82  85   BUN 6 - 20 mg/dL 7  11  7    Creatinine 0.57 - 1.00 mg/dL 9.45  9.44  9.37   BUN/Creat Ratio 9 - 23 13   11    Sodium 134 - 144 mmol/L 136  141  139   Potassium 3.5 - 5.2 mmol/L 4.8  4.0  4.4   Chloride 96 - 106 mmol/L 102  112  107   CO2 20 - 29 mmol/L 18  19  21    Calcium 8.7 - 10.2 mg/dL 8.7  9.1  9.4        Component Value Date/Time   WBC 6.0 06/27/2023 1337   WBC 13.1 (H) 12/31/2021 2317   RBC 3.97 06/27/2023 1337   RBC 3.91 12/31/2021 2317   HGB 13.5 06/27/2023 1337   HCT 40.1 06/27/2023 1337   PLT 284 06/27/2023 1337   MCV 101 (H) 06/27/2023 1337   MCH 34.0 (H) 06/27/2023 1337   MCH 33.8 12/31/2021 2317   MCHC 33.7 06/27/2023 1337   MCHC 33.1 12/31/2021 2317   RDW 13.1 06/27/2023 1337   LYMPHSABS 2.3 12/31/2021 2317   LYMPHSABS 2.0 01/26/2020 1400   MONOABS 0.5 12/31/2021 2317   EOSABS 0.1 12/31/2021 2317   EOSABS 0.0 01/26/2020 1400   BASOSABS 0.1 12/31/2021 2317   BASOSABS 0.0 01/26/2020 1400      Parts of this note may have been dictated using voice recognition software. There may be variances in spelling and vocabulary which are  unintentional. Not all errors are proofread. Please notify the  author if any discrepancies are noted or if the meaning of any statement is not clear.

## 2023-11-12 LAB — THYROID STIMULATING IMMUNOGLOBULIN: TSI: <89 %{baseline} (ref <140)

## 2023-11-12 LAB — TSH: TSH: 0.24 m[IU]/L — ABNORMAL LOW

## 2023-11-12 LAB — T4, FREE: Free T4: 1.1 ng/dL (ref 0.8–1.8)

## 2023-11-12 LAB — T3, FREE: T3, Free: 3.4 pg/mL (ref 2.3–4.2)

## 2023-11-12 LAB — TRAB (TSH RECEPTOR BINDING ANTIBODY): TRAB: <1 IU/L (ref <=2.00)

## 2023-11-14 ENCOUNTER — Ambulatory Visit (HOSPITAL_COMMUNITY)
Admission: RE | Admit: 2023-11-14 | Discharge: 2023-11-14 | Disposition: A | Discharge status: Home / Self Care | Source: Ambulatory Visit | Attending: "Endocrinology | Admitting: "Endocrinology

## 2023-11-14 DIAGNOSIS — E010 Iodine-deficiency related diffuse (endemic) goiter: Secondary | ICD-10-CM | POA: Insufficient documentation

## 2023-12-09 ENCOUNTER — Ambulatory Visit (INDEPENDENT_AMBULATORY_CARE_PROVIDER_SITE_OTHER): Admitting: "Endocrinology

## 2023-12-09 ENCOUNTER — Encounter: Payer: Self-pay | Admitting: "Endocrinology

## 2023-12-09 VITALS — BP 110/80 | HR 99 | Ht 61.0 in | Wt 107.0 lb

## 2023-12-09 DIAGNOSIS — E010 Iodine-deficiency related diffuse (endemic) goiter: Secondary | ICD-10-CM | POA: Diagnosis not present

## 2023-12-09 DIAGNOSIS — E0590 Thyrotoxicosis, unspecified without thyrotoxic crisis or storm: Secondary | ICD-10-CM | POA: Diagnosis not present

## 2023-12-09 NOTE — Progress Notes (Signed)
 Outpatient Endocrinology Note Obadiah Birmingham, MD  12/09/23   Taylor [name] [birth year] [id number]  Referring Provider: Herold Hadassah SQUIBB, MD Primary Care Provider: Herold Hadassah SQUIBB, MD Subjective  No chief complaint on file.   Assessment & Plan  Diagnoses and all orders for this visit:  Subclinical hyperthyroidism -     TSH + free T4  Thyromegaly    Orly Quimby has never taken any thyroid  medication. Patient maintains TSH at her asymptomatic baseline of 0.2-0.3. Marker for Graves' diease: TSI/TRAb -ve Educated on thyroid  axis.  Recommend the following: Continue monitoring.  No indication for treatment. No history of osteoporosis/fractures/arrhthymias.  Repeat labs in 3 months or sooner if symptoms of hyper or hypothyroidism develop.  -complications of untreated hyperthyroidism including atrial fibrillation, heart failure and osteoporosis -side effects of Methimazole including but not limited to allergic reaction, rash, bone marrow suppression, liver dysfunction and teratogenic potential  Thyromegaly noted on exam 02/18/20: Thyroid  U/S, mild nonspecific thyroid  heterogeneity and enlargement. 11/2023 reviewed images an report of thyroid  U/S: reported thyromegaly without nodules  Continue clinical monitoring    I have reviewed current medications, nurse's notes, allergies, vital signs, past medical and surgical history, family medical history, and social history for this encounter. Counseled patient on symptoms, examination findings, lab findings, imaging results, treatment decisions and monitoring and prognosis. The patient understood the recommendations and agrees with the treatment plan. All questions regarding treatment plan were fully answered.   Return in about 6 months (around 06/07/2024) for visit + labs before next visit.   Obadiah Birmingham, MD  12/09/23   I have reviewed current medications, nurse's notes, allergies, vital signs, past medical and surgical  history, family medical history, and social history for this encounter. Counseled patient on symptoms, examination findings, lab findings, imaging results, treatment decisions and monitoring and prognosis. The patient understood the recommendations and agrees with the treatment plan. All questions regarding treatment plan were fully answered.   History of Present Illness Taylor [name] is a 38 y.o. year old female who presents to our clinic with subclinical hyperthyroidism diagnosed in 06/2023.    Never been on thyroid  medication.  Symptoms suggestive of HYPOTHYROIDISM:  fatigue No weight gain No cold intolerance  No constipation  No  Symptoms suggestive of HYPERTHYROIDISM:  weight loss  No heat intolerance No hyperdefecation  No palpitations  No  Compressive symptoms:  dysphagia  No dysphonia  No positional dyspnea (especially with simultaneous arms elevation)  No  Smokes  Yes On biotin  No Personal history of head/neck surgery/irradiation  No  Physical Exam  BP 110/80   Pulse 99   Ht 5' 1 (1.549 m)   Wt 107 lb (48.5 kg)   SpO2 100%   BMI 20.22 kg/m  Constitutional: well developed, well nourished Head: normocephalic, atraumatic, no exophthalmos Eyes: sclera anicteric, no redness Neck: + thyromegaly, no thyroid  tenderness; no nodules palpated Lungs: normal respiratory effort Neurology: alert and oriented, no fine hand tremor Skin: dry, no appreciable rashes Musculoskeletal: no appreciable defects Psychiatric: normal mood and affect  Allergies No Known Allergies  Current Medications Patient's Medications   No medications on file    Past Medical History Past Medical History:  Diagnosis Date   Anemia    Depression    Dysmenorrhea    Hernia, abdominal 01/26/2020   CT scan 01/24/20 --  This area appears concerning for a focal internal hernia without bowel obstruction currently. This is a finding that potentially may place bowel at increased risk  for  obstruction or volvulus. Surgical referral in this regard may well be advisable.     History of abnormal cervical Pap smear 2009   Other specified fracture of left pubis, subsequent encounter for fracture with routine healing 01/26/2022   Sleep apnea     Past Surgical History Past Surgical History:  Procedure Laterality Date   LAPAROSCOPIC TUBAL LIGATION  05/2012   [facility]   TUBAL LIGATION      Family History family history includes Cancer in her maternal aunt; Diabetes in her maternal grandmother and mother; Heart disease in her maternal grandmother; Sickle cell anemia in her sister.  Social History Social History   Socioeconomic History   Marital status: Married    Spouse name: Janne Marlin   Number of children: Not on file   Years of education: Not on file   Highest education level: Some college, no degree  Occupational History    Employer: [employer]  Tobacco Use   Smoking status: Every Day    Current packs/day: 0.50    Average packs/day: 0.5 packs/day for 15.6 years (7.8 ttl pk-yrs)    Types: Cigarettes    Start date: 05/13/2008   Smokeless tobacco: Never  Vaping Use   Vaping status: Never Used  Substance and Sexual Activity   Alcohol use: No   Drug use: No   Sexual activity: Yes    Birth control/protection: Surgical    Comment: BTL3  Other Topics Concern   Not on file  Social History Narrative   Not on file   Social Drivers of Health   Financial Resource Strain: Low Risk  (06/23/2023)   Overall Financial Resource Strain (CARDIA)    Difficulty of Paying Living Expenses: Not hard at all  Food Insecurity: No Food Insecurity (06/23/2023)   Hunger Vital Sign    Worried About Running Out of Food in the Last Year: Never true    Ran Out of Food in the Last Year: Never true  Transportation Needs: No Transportation Needs (06/23/2023)   PRAPARE - Administrator, Civil Service (Medical): No    Lack of Transportation (Non-Medical): No  Physical  Activity: Unknown (06/23/2023)   Exercise Vital Sign    Days of Exercise per Week: 0 days    Minutes of Exercise per Session: Not on file  Stress: No Stress Concern Present (06/23/2023)   [employer] of Occupational Health - Occupational Stress Questionnaire    Feeling of Stress : Only a little  Social Connections: Moderately Integrated (06/23/2023)   Social Connection and Isolation Panel    Frequency of Communication with Friends and Family: More than three times a week    Frequency of Social Gatherings with Friends and Family: Twice a week    Attends Religious Services: More than 4 times per year    Active Member of [employer] or Organizations: No    Attends Banker Meetings: Not on file    Marital Status: Living with partner  Intimate Partner Violence: Unknown (05/10/2018)   Humiliation, Afraid, Rape, and Kick questionnaire    Fear of Current or Ex-Partner: Patient declined    Emotionally Abused: Patient declined    Physically Abused: Patient declined    Sexually Abused: Patient declined    Laboratory Investigations Lab Results  Component Value Date   TSH 0.24 (L) 11/07/2023   TSH 0.283 (L) 06/27/2023   TSH 0.226 (L) 02/24/2020   FREET4 1.1 11/07/2023   FREET4 0.91 02/24/2020     Lab  Results  Component Value Date   TSI <89 11/07/2023     No components found for: TRAB   Lab Results  Component Value Date   CHOL 210 (H) 06/27/2023   Lab Results  Component Value Date   HDL 72 06/27/2023   Lab Results  Component Value Date   LDLCALC 93 06/27/2023   Lab Results  Component Value Date   TRIG 276 (H) 06/27/2023   Lab Results  Component Value Date   CHOLHDL 2.9 06/27/2023   Lab Results  Component Value Date   CREATININE 0.54 (L) 06/27/2023   No results found for: GFR    Component Value Date/Time   NA 136 06/27/2023 1337   K 4.8 06/27/2023 1337   CL 102 06/27/2023 1337   CO2 18 (L) 06/27/2023 1337   GLUCOSE 77 06/27/2023 1337   GLUCOSE 82  12/31/2021 2317   BUN 7 06/27/2023 1337   CREATININE 0.54 (L) 06/27/2023 1337   CALCIUM 8.7 06/27/2023 1337   PROT 6.7 06/27/2023 1337   ALBUMIN 4.5 06/27/2023 1337   AST 19 06/27/2023 1337   ALT 10 06/27/2023 1337   ALKPHOS 82 06/27/2023 1337   BILITOT 0.3 06/27/2023 1337   GFRNONAA >60 12/31/2021 2317   GFRAA 138 01/26/2020 1400      Latest Ref Rng & Units 06/27/2023    1:37 PM 12/31/2021   11:17 PM 01/26/2020    2:00 PM  BMP  Glucose 70 - 99 mg/dL 77  82  85   BUN 6 - 20 mg/dL 7  11  7    Creatinine 0.57 - 1.00 mg/dL 9.45  9.44  9.37   BUN/Creat Ratio 9 - 23 13   11    Sodium 134 - 144 mmol/L 136  141  139   Potassium 3.5 - 5.2 mmol/L 4.8  4.0  4.4   Chloride 96 - 106 mmol/L 102  112  107   CO2 20 - 29 mmol/L 18  19  21    Calcium 8.7 - 10.2 mg/dL 8.7  9.1  9.4        Component Value Date/Time   WBC 6.0 06/27/2023 1337   WBC 13.1 (H) 12/31/2021 2317   RBC 3.97 06/27/2023 1337   RBC 3.91 12/31/2021 2317   HGB 13.5 06/27/2023 1337   HCT 40.1 06/27/2023 1337   PLT 284 06/27/2023 1337   MCV 101 (H) 06/27/2023 1337   MCH 34.0 (H) 06/27/2023 1337   MCH 33.8 12/31/2021 2317   MCHC 33.7 06/27/2023 1337   MCHC 33.1 12/31/2021 2317   RDW 13.1 06/27/2023 1337   LYMPHSABS 2.3 12/31/2021 2317   LYMPHSABS 2.0 01/26/2020 1400   MONOABS 0.5 12/31/2021 2317   EOSABS 0.1 12/31/2021 2317   EOSABS 0.0 01/26/2020 1400   BASOSABS 0.1 12/31/2021 2317   BASOSABS 0.0 01/26/2020 1400      Parts of this note may have been dictated using voice recognition software. There may be variances in spelling and vocabulary which are unintentional. Not all errors are proofread. Please notify the dino if any discrepancies are noted or if the meaning of any statement is not clear.

## 2024-06-03 ENCOUNTER — Other Ambulatory Visit

## 2024-06-08 ENCOUNTER — Ambulatory Visit: Admitting: "Endocrinology

## 2024-07-29 ENCOUNTER — Encounter: Admitting: Pediatrics
